# Patient Record
Sex: Male | Born: 1935 | Race: White | Hispanic: No | Marital: Married | Smoking: Never smoker
Health system: Southern US, Community
[De-identification: ages and names within clinical notes are randomized; demographics above are authoritative.]

## PROBLEM LIST (undated history)

## (undated) DIAGNOSIS — E079 Disorder of thyroid, unspecified: Secondary | ICD-10-CM

## (undated) DIAGNOSIS — K219 Gastro-esophageal reflux disease without esophagitis: Secondary | ICD-10-CM

## (undated) DIAGNOSIS — G20A1 Parkinson's disease without dyskinesia, without mention of fluctuations: Secondary | ICD-10-CM

## (undated) DIAGNOSIS — F0390 Unspecified dementia without behavioral disturbance: Secondary | ICD-10-CM

## (undated) DIAGNOSIS — G20 Parkinson's disease: Secondary | ICD-10-CM

## (undated) HISTORY — PX: PACEMAKER PLACEMENT: SHX43

## (undated) HISTORY — PX: DEEP BRAIN STIMULATOR PLACEMENT: SHX608

---

## 2013-02-03 ENCOUNTER — Emergency Department (HOSPITAL_COMMUNITY): Payer: Medicare Other

## 2013-02-03 ENCOUNTER — Emergency Department (HOSPITAL_COMMUNITY)
Admission: EM | Admit: 2013-02-03 | Discharge: 2013-02-03 | Disposition: A | Discharge status: Home / Self Care | Payer: Medicare Other | Attending: Emergency Medicine | Admitting: Emergency Medicine

## 2013-02-03 ENCOUNTER — Encounter (HOSPITAL_COMMUNITY): Payer: Self-pay | Admitting: Emergency Medicine

## 2013-02-03 DIAGNOSIS — S42123A Displaced fracture of acromial process, unspecified shoulder, initial encounter for closed fracture: Secondary | ICD-10-CM | POA: Insufficient documentation

## 2013-02-03 DIAGNOSIS — G20 Parkinson's disease: Secondary | ICD-10-CM | POA: Insufficient documentation

## 2013-02-03 DIAGNOSIS — Z79899 Other long term (current) drug therapy: Secondary | ICD-10-CM | POA: Insufficient documentation

## 2013-02-03 DIAGNOSIS — F0280 Dementia in other diseases classified elsewhere without behavioral disturbance: Secondary | ICD-10-CM | POA: Insufficient documentation

## 2013-02-03 DIAGNOSIS — S42121A Displaced fracture of acromial process, right shoulder, initial encounter for closed fracture: Secondary | ICD-10-CM

## 2013-02-03 DIAGNOSIS — G20A1 Parkinson's disease without dyskinesia, without mention of fluctuations: Secondary | ICD-10-CM | POA: Insufficient documentation

## 2013-02-03 DIAGNOSIS — Y9389 Activity, other specified: Secondary | ICD-10-CM | POA: Insufficient documentation

## 2013-02-03 DIAGNOSIS — Y92410 Unspecified street and highway as the place of occurrence of the external cause: Secondary | ICD-10-CM | POA: Insufficient documentation

## 2013-02-03 DIAGNOSIS — Z791 Long term (current) use of non-steroidal anti-inflammatories (NSAID): Secondary | ICD-10-CM | POA: Insufficient documentation

## 2013-02-03 HISTORY — DX: Unspecified dementia, unspecified severity, without behavioral disturbance, psychotic disturbance, mood disturbance, and anxiety: F03.90

## 2013-02-03 MED ORDER — NAPROXEN 500 MG PO TABS: 500.0000 mg | ORAL_TABLET | Freq: Two times a day (BID) | ORAL | Status: DC

## 2013-02-03 MED ORDER — TRAMADOL HCL 50 MG PO TABS: 50.0000 mg | ORAL_TABLET | Freq: Four times a day (QID) | ORAL | Status: DC | PRN

## 2013-02-03 MED ORDER — TRAMADOL HCL 50 MG PO TABS
50.0000 mg | ORAL_TABLET | Freq: Once | ORAL | Status: AC
  Administered 2013-02-03: 50 mg via ORAL
  Filled 2013-02-03: qty 1
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
## 2017-05-06 ENCOUNTER — Other Ambulatory Visit (HOSPITAL_COMMUNITY)
Admission: RE | Admit: 2017-05-06 | Discharge: 2017-05-06 | Disposition: A | Discharge status: Home / Self Care | Payer: Medicare Other | Source: Ambulatory Visit | Attending: Family Medicine | Admitting: Family Medicine

## 2017-05-06 DIAGNOSIS — N400 Enlarged prostate without lower urinary tract symptoms: Secondary | ICD-10-CM | POA: Diagnosis present

## 2017-05-06 DIAGNOSIS — E291 Testicular hypofunction: Secondary | ICD-10-CM | POA: Diagnosis present

## 2017-05-06 DIAGNOSIS — R7309 Other abnormal glucose: Secondary | ICD-10-CM | POA: Diagnosis present

## 2017-05-06 DIAGNOSIS — R5381 Other malaise: Secondary | ICD-10-CM | POA: Diagnosis present

## 2017-05-06 LAB — CBC WITH DIFFERENTIAL/PLATELET
BASOS ABS: 0.1 10*3/uL (ref 0.0–0.1)
BASOS PCT: 1 %
Eosinophils Absolute: 0.2 10*3/uL (ref 0.0–0.7)
Eosinophils Relative: 4 %
HCT: 40.5 % (ref 39.0–52.0)
Hemoglobin: 13.1 g/dL (ref 13.0–17.0)
Lymphocytes Relative: 14 %
Lymphs Abs: 0.8 10*3/uL (ref 0.7–4.0)
MCH: 30.2 pg (ref 26.0–34.0)
MCHC: 32.3 g/dL (ref 30.0–36.0)
MCV: 93.3 fL (ref 78.0–100.0)
MONO ABS: 0.4 10*3/uL (ref 0.1–1.0)
Monocytes Relative: 7 %
NEUTROS ABS: 4.3 10*3/uL (ref 1.7–7.7)
Neutrophils Relative %: 74 %
PLATELETS: 314 10*3/uL (ref 150–400)
RBC: 4.34 MIL/uL (ref 4.22–5.81)
RDW: 13.2 % (ref 11.5–15.5)
WBC: 5.7 10*3/uL (ref 4.0–10.5)

## 2017-05-06 LAB — COMPREHENSIVE METABOLIC PANEL
ALK PHOS: 68 U/L (ref 38–126)
ALT: 17 U/L (ref 17–63)
ANION GAP: 10 (ref 5–15)
AST: 30 U/L (ref 15–41)
Albumin: 3.8 g/dL (ref 3.5–5.0)
BUN: 23 mg/dL — ABNORMAL HIGH (ref 6–20)
CO2: 28 mmol/L (ref 22–32)
CREATININE: 1.15 mg/dL (ref 0.61–1.24)
Calcium: 9.3 mg/dL (ref 8.9–10.3)
Chloride: 98 mmol/L — ABNORMAL LOW (ref 101–111)
GFR calc Af Amer: >60 mL/min (ref >60)
GFR calc non Af Amer: 58 mL/min — ABNORMAL LOW (ref >60)
GLUCOSE: 105 mg/dL — AB (ref 65–99)
Potassium: 4.1 mmol/L (ref 3.5–5.1)
Sodium: 136 mmol/L (ref 135–145)
TOTAL PROTEIN: 6.7 g/dL (ref 6.5–8.1)
Total Bilirubin: 0.6 mg/dL (ref 0.3–1.2)

## 2017-05-06 LAB — T4, FREE: FREE T4: 0.81 ng/dL (ref 0.61–1.12)

## 2017-05-06 LAB — VITAMIN B12: Vitamin B-12: 936 pg/mL — ABNORMAL HIGH (ref 180–914)

## 2017-05-06 LAB — TSH: TSH: 1.403 u[IU]/mL (ref 0.350–4.500)

## 2017-05-06 LAB — PSA: Prostatic Specific Antigen: 0.99 ng/mL (ref 0.00–4.00)

## 2017-05-06 LAB — FERRITIN: Ferritin: 92 ng/mL (ref 24–336)

## 2017-05-07 LAB — DHEA-SULFATE: DHEA-SO4: 375.4 ug/dL — ABNORMAL HIGH (ref 20.8–226.4)

## 2017-05-07 LAB — T3, FREE: T3 FREE: 2.8 pg/mL (ref 2.0–4.4)

## 2017-05-07 LAB — MISC LABCORP TEST (SEND OUT): Labcorp test code: 82016

## 2017-05-07 LAB — ESTRADIOL: ESTRADIOL: 37.4 pg/mL (ref 7.6–42.6)

## 2017-05-11 LAB — MISC LABCORP TEST (SEND OUT): Labcorp test code: 140707

## 2018-01-07 ENCOUNTER — Emergency Department (HOSPITAL_COMMUNITY)
Admission: EM | Admit: 2018-01-07 | Discharge: 2018-01-07 | Disposition: A | Discharge status: Home / Self Care | Payer: Medicare Other | Attending: Emergency Medicine | Admitting: Emergency Medicine

## 2018-01-07 ENCOUNTER — Other Ambulatory Visit: Payer: Self-pay

## 2018-01-07 ENCOUNTER — Encounter (HOSPITAL_COMMUNITY): Payer: Self-pay

## 2018-01-07 ENCOUNTER — Emergency Department (HOSPITAL_COMMUNITY): Payer: Medicare Other

## 2018-01-07 DIAGNOSIS — G20 Parkinson's disease: Secondary | ICD-10-CM | POA: Insufficient documentation

## 2018-01-07 DIAGNOSIS — K5641 Fecal impaction: Secondary | ICD-10-CM

## 2018-01-07 DIAGNOSIS — K5900 Constipation, unspecified: Secondary | ICD-10-CM | POA: Diagnosis present

## 2018-01-07 DIAGNOSIS — R339 Retention of urine, unspecified: Secondary | ICD-10-CM | POA: Insufficient documentation

## 2018-01-07 DIAGNOSIS — F0280 Dementia in other diseases classified elsewhere without behavioral disturbance: Secondary | ICD-10-CM | POA: Diagnosis not present

## 2018-01-07 DIAGNOSIS — Z950 Presence of cardiac pacemaker: Secondary | ICD-10-CM | POA: Insufficient documentation

## 2018-01-07 HISTORY — DX: Parkinson's disease without dyskinesia, without mention of fluctuations: G20.A1

## 2018-01-07 HISTORY — DX: Parkinson's disease: G20

## 2018-01-07 LAB — URINALYSIS, ROUTINE W REFLEX MICROSCOPIC
BILIRUBIN URINE: NEGATIVE
GLUCOSE, UA: NEGATIVE mg/dL
Hgb urine dipstick: NEGATIVE
KETONES UR: NEGATIVE mg/dL
Leukocytes, UA: NEGATIVE
Nitrite: NEGATIVE
PH: 7 (ref 5.0–8.0)
Protein, ur: NEGATIVE mg/dL
SPECIFIC GRAVITY, URINE: 1.017 (ref 1.005–1.030)

## 2018-01-07 LAB — CBC WITH DIFFERENTIAL/PLATELET
BASOS ABS: 0.1 10*3/uL (ref 0.0–0.1)
BASOS PCT: 0 %
EOS ABS: 0.1 10*3/uL (ref 0.0–0.7)
Eosinophils Relative: 1 %
HCT: 38.4 % — ABNORMAL LOW (ref 39.0–52.0)
HEMOGLOBIN: 12.7 g/dL — AB (ref 13.0–17.0)
LYMPHS ABS: 0.8 10*3/uL (ref 0.7–4.0)
Lymphocytes Relative: 6 %
MCH: 31.1 pg (ref 26.0–34.0)
MCHC: 33.1 g/dL (ref 30.0–36.0)
MCV: 94.1 fL (ref 78.0–100.0)
Monocytes Absolute: 1 10*3/uL (ref 0.1–1.0)
Monocytes Relative: 7 %
NEUTROS PCT: 86 %
Neutro Abs: 11.2 10*3/uL — ABNORMAL HIGH (ref 1.7–7.7)
PLATELETS: 329 10*3/uL (ref 150–400)
RBC: 4.08 MIL/uL — AB (ref 4.22–5.81)
RDW: 13.7 % (ref 11.5–15.5)
WBC: 13.1 10*3/uL — AB (ref 4.0–10.5)

## 2018-01-07 LAB — COMPREHENSIVE METABOLIC PANEL
ALBUMIN: 3.9 g/dL (ref 3.5–5.0)
ALT: 7 U/L (ref 0–44)
AST: 20 U/L (ref 15–41)
Alkaline Phosphatase: 105 U/L (ref 38–126)
Anion gap: 7 (ref 5–15)
BUN: 27 mg/dL — ABNORMAL HIGH (ref 8–23)
CHLORIDE: 102 mmol/L (ref 98–111)
CO2: 27 mmol/L (ref 22–32)
CREATININE: 1.16 mg/dL (ref 0.61–1.24)
Calcium: 9.1 mg/dL (ref 8.9–10.3)
GFR calc Af Amer: >60 mL/min (ref >60)
GFR calc non Af Amer: 57 mL/min — ABNORMAL LOW (ref >60)
GLUCOSE: 107 mg/dL — AB (ref 70–99)
Potassium: 4.6 mmol/L (ref 3.5–5.1)
SODIUM: 136 mmol/L (ref 135–145)
Total Bilirubin: 0.7 mg/dL (ref 0.3–1.2)
Total Protein: 6.6 g/dL (ref 6.5–8.1)

## 2018-01-07 MED ORDER — MAGNESIUM CITRATE PO SOLN
1.0000 | Freq: Once | ORAL | Status: AC
  Administered 2018-01-07: 1 via ORAL
  Filled 2018-01-07: qty 296

## 2018-01-07 MED ORDER — SORBITOL 70 % SOLN
960.0000 mL | TOPICAL_OIL | Freq: Once | ORAL | Status: DC
  Filled 2018-01-07: qty 473

## 2018-01-07 MED ORDER — IOHEXOL 300 MG/ML  SOLN
100.0000 mL | Freq: Once | INTRAMUSCULAR | Status: AC | PRN
  Administered 2018-01-07: 100 mL via INTRAVENOUS

## 2018-01-07 MED ORDER — POLYETHYLENE GLYCOL 3350 17 G PO PACK: 17.0000 g | PACK | Freq: Every day | ORAL | 0 refills | Status: AC

## 2018-01-07 MED ORDER — FENTANYL CITRATE (PF) 100 MCG/2ML IJ SOLN
50.0000 ug | Freq: Once | INTRAMUSCULAR | Status: AC
  Administered 2018-01-07: 50 ug via INTRAMUSCULAR
  Filled 2018-01-07: qty 2

## 2018-01-07 NOTE — ED Notes (Signed)
Glycerin enema given.

## 2018-01-07 NOTE — Discharge Instructions (Signed)

## 2018-01-07 NOTE — ED Triage Notes (Signed)
Pt complaining of fecal impaction. Has tried 2 suppositories and enema without being able to get stool out.

## 2018-01-08 NOTE — ED Provider Notes (Signed)
Emergency Department Provider Note   I have reviewed the triage vital signs and the nursing notes.   HISTORY  Chief Complaint Fecal Impaction   HPI Gary Sellers is a 83 y.o. male with history of dementia Parkinson's disease who presents to the emergency department today secondary to constipation.  Patient has had no bowel movement last week and had hard bowel movements prior to that.  No recent medication changes but he did just get a deep brain stimulator placed approximately a week ago.  Patient states it hurts around his rectal area he is able to have small bowel movements and leakage of fluid but no significant amount.  Persistent worsening pain.  Also with pain in his lower abdomen. No other associated or modifying symptoms.    Past Medical History:  Diagnosis Date  . Dementia   . Parkinson disease (HCC)     There are no active problems to display for this patient.   Past Surgical History:  Procedure Laterality Date  . DEEP BRAIN STIMULATOR PLACEMENT    . PACEMAKER PLACEMENT      Current Outpatient Rx  . Order #: [id number] Class: Historical Med  . Order #: [id number] Class: Historical Med  . Order #: [id number] Class: Historical Med  . Order #: [id number] Class: Historical Med  . Order #: [id number] Class: Historical Med  . Order #: [id number] Class: Historical Med  . Order #: [id number] Class: Historical Med  . Order #: [id number] Class: Historical Med  . Order #: [id number] Class: Historical Med  . Order #: [id number] Class: Historical Med  . Order #: [id number] Class: Historical Med  . Order #: [id number] Class: Historical Med  . Order #: [id number] Class: Historical Med  . Order #: [id number] Class: Historical Med  . Order #: [id number] Class: Historical Med  . Order #: [id number] Class: Historical Med  . Order #: [id number] Class: Historical Med  . Order #: [id number] Class: Print  . Order #: [id number] Class: Print    Allergies Patient has no known allergies.  History reviewed. No  pertinent family history.  Social History Social History   Tobacco Use  . Smoking status: Never Smoker  . Smokeless tobacco: Never Used  Substance Use Topics  . Alcohol use: Never    Frequency: Never  . Drug use: Never    Review of Systems  All other systems negative except as documented in the HPI. All pertinent positives and negatives as reviewed in the HPI. ____________________________________________   PHYSICAL EXAM:  VITAL SIGNS: ED Triage Vitals [01/07/18 1530]  Enc Vitals Group     BP 137/78     Pulse Rate 87     Resp 18     Temp 97.6 F (36.4 C)     Temp Source Oral     SpO2 100 %     Weight 153 lb (69.4 kg)     Height 5' 10.5" (1.791 m)    Constitutional: Alert and oriented. Well appearing and in no acute distress. Eyes: Conjunctivae are normal. PERRL. EOMI. Head: Atraumatic. Nose: No congestion/rhinnorhea. Mouth/Throat: Mucous membranes are moist.  Oropharynx non-erythematous. Neck: No stridor.  No meningeal signs.   Cardiovascular: Normal rate, regular rhythm. Good peripheral circulation. Grossly normal heart sounds.   Respiratory: Normal respiratory effort.  No retractions. Lungs CTAB. Gastrointestinal: Soft and suprapubic ttp with bladder distension. No distention.  Musculoskeletal: No lower extremity tenderness nor edema. No gross deformities of extremities. Neurologic:  Normal speech and language. No gross focal neurologic deficits are appreciated.  Skin:  Skin is warm, dry and intact. No rash noted.  ____________________________________________  LABS (all labs ordered are listed, but only abnormal results are displayed)  Labs Reviewed  CBC WITH DIFFERENTIAL/PLATELET - Abnormal; Notable for the following components:      Result Value   WBC 13.1 (*)    RBC 4.08 (*)    Hemoglobin 12.7 (*)    HCT 38.4 (*)    Neutro Abs 11.2 (*)    All other components within normal limits  COMPREHENSIVE METABOLIC PANEL - Abnormal; Notable for the following  components:   Glucose, Bld 107 (*)    BUN 27 (*)    GFR calc non Af Amer 57 (*)    All other components within normal limits  URINALYSIS, ROUTINE W REFLEX MICROSCOPIC   ____________________________________________   RADIOLOGY  Ct Abdomen Pelvis W Contrast  Result Date: 01/07/2018 CLINICAL DATA:  Constipation and possible fecal impaction. EXAM: CT ABDOMEN AND PELVIS WITH CONTRAST TECHNIQUE: Multidetector CT imaging of the abdomen and pelvis was performed using the standard protocol following bolus administration of intravenous contrast. CONTRAST:  OMNIPAQUE IOHEXOL 300 MG/ML  SOLN COMPARISON:  None. FINDINGS: LOWER CHEST: There is no basilar pleural or apical pericardial effusion. HEPATOBILIARY: The hepatic contours and density are normal. There is no intra- or extrahepatic biliary dilatation. The gallbladder is normal. PANCREAS: The pancreatic parenchymal contours are normal and there is no ductal dilatation. There is no peripancreatic fluid collection. SPLEEN: Normal. ADRENALS/URINARY TRACT: --Adrenal glands: Normal. --Right kidney/ureter: There is pelviectasis and mild proximal ureteral dilatation. No obstruction identified. --Left kidney/ureter: Mild left ureteral dilatation. --Urinary bladder: Urinary bladder is markedly distended. STOMACH/BOWEL: --Stomach/Duodenum: There is no hiatal hernia or other gastric abnormality. The duodenal course and caliber are normal. --Small bowel: No dilatation or inflammation. --Colon: There is a large stool ball in the rectum, measuring 8.4 cm transverse by 7.5 cm AP. There is a large volume of colonic stool. --Appendix: Not visualized. No right lower quadrant inflammation or free fluid. VASCULAR/LYMPHATIC: Atherosclerotic calcification is present within the non-aneurysmal abdominal aorta, without hemodynamically significant stenosis. The portal vein, splenic vein, superior mesenteric vein and IVC are patent. No abdominal or pelvic lymphadenopathy.  REPRODUCTIVE: The prostate is displaced anteriorly by the large rectal stool ball. MUSCULOSKELETAL. Multilevel degenerative disc disease and facet arthrosis. No bony spinal canal stenosis. OTHER: None. IMPRESSION: 1. Large amount of stool throughout the colon with suspected fecal impaction. Rectal stool ball measures 8.4 x 7.5 cm on axial images. No evidence of stercoral colitis. 2. Markedly distended urinary bladder with associated mild hydroureter and pelviectasis. There may be a bladder outlet obstruction secondary to mass effect from stool ball. The prostate is deviated anteriorly and mildly flattened. 3.  Aortic Atherosclerosis (ICD10-I70.0). Electronically Signed   By: Deatra Robinson M.D.   On: 01/07/2018 20:48    ____________________________________________   PROCEDURES  Procedure(s) performed:   Fecal disimpaction Date/Time: 01/08/2018 6:40 PM Performed by: Marily Memos, MD Authorized by: Marily Memos, MD  Consent: Verbal consent obtained. Risks and benefits: risks, benefits and alternatives were discussed Consent given by: patient and spouse Patient understanding: patient states understanding of the procedure being performed Patient consent: the patient's understanding of the procedure matches consent given Required items: required blood products, implants, devices, and special equipment available Patient identity confirmed: verbally with patient Time out: Immediately prior to procedure a "time out" was called to verify the correct patient, procedure, equipment, support staff and site/side marked as required. Preparation: Patient was prepped and draped in the usual sterile fashion. Local anesthesia used: no  Anesthesia: Local anesthesia  used: no  Sedation: Patient sedated: no  Patient tolerance: Patient tolerated the procedure well with no immediate complications Comments: Large amount of hard, nonbloody stool removed. Subsequently a fleets enema was placed. Approximately 30  minutes later the patient had a large bowel movement and relief in those symptoms.    ____________________________________________   INITIAL IMPRESSION / ASSESSMENT AND PLAN / ED COURSE  Patient with fecal impaction of unclear etiology likely causing bladder outlet obstruction.  I did disimpact him he had a large bowel movement but was still retaining urine so a catheter was placed.  He will do a bowel cleanout regimen at home and follow-up with his doctor for catheter removal as I suspect the ability urinate normally after that.  No indication for admission at this time.  Stable for discharge.     Pertinent labs & imaging results that were available during my care of the patient were reviewed by me and considered in my medical decision making (see chart for details).  ____________________________________________  FINAL CLINICAL IMPRESSION(S) / ED DIAGNOSES  Final diagnoses:  Urinary retention  Fecal impaction (HCC)     MEDICATIONS GIVEN DURING THIS VISIT:  Medications  fentaNYL (SUBLIMAZE) injection 50 mcg (50 mcg Intramuscular Given 01/07/18 1901)  iohexol (OMNIPAQUE) 300 MG/ML solution 100 mL (100 mLs Intravenous Contrast Given 01/07/18 2016)  magnesium citrate solution 1 Bottle (1 Bottle Oral Given 01/07/18 2330)     NEW OUTPATIENT MEDICATIONS STARTED DURING THIS VISIT:  Discharge Medication List as of 01/07/2018 11:19 PM    START taking these medications   Details  polyethylene glycol (MIRALAX / GLYCOLAX) packet Take 17 g by mouth daily., Starting Thu 01/07/2018, Print        Note:  This note was prepared with assistance of Dragon voice recognition software. Occasional wrong-word or sound-a-like substitutions may have occurred due to the inherent limitations of voice recognition software.   Marily MemosMesner, Tamber Burtch, MD 01/08/18 [phone]

## 2019-12-28 ENCOUNTER — Inpatient Hospital Stay (HOSPITAL_COMMUNITY)
Admission: EM | Admit: 2019-12-28 | Discharge: 2020-01-04 | DRG: 175 | Disposition: A | Discharge status: Skilled Nursing Facility | Payer: Medicare Other | Attending: Family Medicine | Admitting: Family Medicine

## 2019-12-28 ENCOUNTER — Emergency Department (HOSPITAL_COMMUNITY): Payer: Medicare Other

## 2019-12-28 ENCOUNTER — Other Ambulatory Visit: Payer: Self-pay

## 2019-12-28 ENCOUNTER — Encounter (HOSPITAL_COMMUNITY): Payer: Self-pay

## 2019-12-28 DIAGNOSIS — J9601 Acute respiratory failure with hypoxia: Secondary | ICD-10-CM | POA: Diagnosis present

## 2019-12-28 DIAGNOSIS — R0602 Shortness of breath: Secondary | ICD-10-CM | POA: Diagnosis not present

## 2019-12-28 DIAGNOSIS — Z20822 Contact with and (suspected) exposure to covid-19: Secondary | ICD-10-CM | POA: Diagnosis present

## 2019-12-28 DIAGNOSIS — N1831 Chronic kidney disease, stage 3a: Secondary | ICD-10-CM | POA: Diagnosis present

## 2019-12-28 DIAGNOSIS — F0280 Dementia in other diseases classified elsewhere without behavioral disturbance: Secondary | ICD-10-CM | POA: Diagnosis present

## 2019-12-28 DIAGNOSIS — Z79899 Other long term (current) drug therapy: Secondary | ICD-10-CM | POA: Diagnosis not present

## 2019-12-28 DIAGNOSIS — K219 Gastro-esophageal reflux disease without esophagitis: Secondary | ICD-10-CM | POA: Diagnosis present

## 2019-12-28 DIAGNOSIS — I2694 Multiple subsegmental pulmonary emboli without acute cor pulmonale: Principal | ICD-10-CM | POA: Diagnosis present

## 2019-12-28 DIAGNOSIS — E039 Hypothyroidism, unspecified: Secondary | ICD-10-CM | POA: Diagnosis present

## 2019-12-28 DIAGNOSIS — D72829 Elevated white blood cell count, unspecified: Secondary | ICD-10-CM | POA: Diagnosis present

## 2019-12-28 DIAGNOSIS — G20 Parkinson's disease: Secondary | ICD-10-CM | POA: Diagnosis present

## 2019-12-28 DIAGNOSIS — Z950 Presence of cardiac pacemaker: Secondary | ICD-10-CM

## 2019-12-28 DIAGNOSIS — I361 Nonrheumatic tricuspid (valve) insufficiency: Secondary | ICD-10-CM | POA: Diagnosis not present

## 2019-12-28 DIAGNOSIS — I2699 Other pulmonary embolism without acute cor pulmonale: Secondary | ICD-10-CM | POA: Diagnosis not present

## 2019-12-28 DIAGNOSIS — I2609 Other pulmonary embolism with acute cor pulmonale: Secondary | ICD-10-CM | POA: Diagnosis not present

## 2019-12-28 LAB — COMPREHENSIVE METABOLIC PANEL
ALT: <5 U/L (ref 0–44)
AST: 20 U/L (ref 15–41)
Albumin: 3.5 g/dL (ref 3.5–5.0)
Alkaline Phosphatase: 71 U/L (ref 38–126)
Anion gap: 11 (ref 5–15)
BUN: 29 mg/dL — ABNORMAL HIGH (ref 8–23)
CO2: 22 mmol/L (ref 22–32)
Calcium: 8.9 mg/dL (ref 8.9–10.3)
Chloride: 104 mmol/L (ref 98–111)
Creatinine, Ser: 1.19 mg/dL (ref 0.61–1.24)
GFR calc Af Amer: >60 mL/min (ref >60)
GFR calc non Af Amer: 56 mL/min — ABNORMAL LOW (ref >60)
Glucose, Bld: 166 mg/dL — ABNORMAL HIGH (ref 70–99)
Potassium: 4.6 mmol/L (ref 3.5–5.1)
Sodium: 137 mmol/L (ref 135–145)
Total Bilirubin: 0.9 mg/dL (ref 0.3–1.2)
Total Protein: 6.1 g/dL — ABNORMAL LOW (ref 6.5–8.1)

## 2019-12-28 LAB — CBC
HCT: 41 % (ref 39.0–52.0)
Hemoglobin: 12.8 g/dL — ABNORMAL LOW (ref 13.0–17.0)
MCH: 29.7 pg (ref 26.0–34.0)
MCHC: 31.2 g/dL (ref 30.0–36.0)
MCV: 95.1 fL (ref 80.0–100.0)
Platelets: 201 10*3/uL (ref 150–400)
RBC: 4.31 MIL/uL (ref 4.22–5.81)
RDW: 13.2 % (ref 11.5–15.5)
WBC: 13.2 10*3/uL — ABNORMAL HIGH (ref 4.0–10.5)
nRBC: 0 % (ref 0.0–0.2)

## 2019-12-28 LAB — URINALYSIS, ROUTINE W REFLEX MICROSCOPIC
Bilirubin Urine: NEGATIVE
Glucose, UA: NEGATIVE mg/dL
Hgb urine dipstick: NEGATIVE
Ketones, ur: NEGATIVE mg/dL
Leukocytes,Ua: NEGATIVE
Nitrite: NEGATIVE
Protein, ur: NEGATIVE mg/dL
Specific Gravity, Urine: 1.009 (ref 1.005–1.030)
pH: 6 (ref 5.0–8.0)

## 2019-12-28 LAB — LACTIC ACID, PLASMA
Lactic Acid, Venous: 2.1 mmol/L (ref 0.5–1.9)
Lactic Acid, Venous: 1.4 mmol/L (ref 0.5–1.9)

## 2019-12-28 LAB — TROPONIN I (HIGH SENSITIVITY)
Troponin I (High Sensitivity): 297 ng/L (ref <18)
Troponin I (High Sensitivity): 307 ng/L (ref <18)

## 2019-12-28 LAB — BRAIN NATRIURETIC PEPTIDE: B Natriuretic Peptide: 752 pg/mL — ABNORMAL HIGH (ref 0.0–100.0)

## 2019-12-28 LAB — SARS CORONAVIRUS 2 BY RT PCR (HOSPITAL ORDER, PERFORMED IN ~~LOC~~ HOSPITAL LAB): SARS Coronavirus 2: NEGATIVE

## 2019-12-28 MED ORDER — HEPARIN BOLUS VIA INFUSION
4000.0000 [IU] | Freq: Once | INTRAVENOUS | Status: AC
  Administered 2019-12-28: 4000 [IU] via INTRAVENOUS

## 2019-12-28 MED ORDER — IOHEXOL 350 MG/ML SOLN
100.0000 mL | Freq: Once | INTRAVENOUS | Status: AC | PRN
  Administered 2019-12-28: 100 mL via INTRAVENOUS

## 2019-12-28 MED ORDER — SODIUM CHLORIDE 0.9 % IV BOLUS
1000.0000 mL | Freq: Once | INTRAVENOUS | Status: AC
  Administered 2019-12-28: 1000 mL via INTRAVENOUS

## 2019-12-28 MED ORDER — IPRATROPIUM-ALBUTEROL 0.5-2.5 (3) MG/3ML IN SOLN
3.0000 mL | Freq: Once | RESPIRATORY_TRACT | Status: AC
  Administered 2019-12-28: 3 mL via RESPIRATORY_TRACT
  Filled 2019-12-28: qty 3

## 2019-12-28 MED ORDER — HEPARIN (PORCINE) 25000 UT/250ML-% IV SOLN
1500.0000 [IU]/h | INTRAVENOUS | Status: DC
  Administered 2019-12-28: 1250 [IU]/h via INTRAVENOUS
  Administered 2019-12-30 – 2020-01-02 (×5): 1500 [IU]/h via INTRAVENOUS
  Filled 2019-12-28 (×7): qty 250

## 2019-12-28 NOTE — Discharge Instructions (Addendum)
You were evaluated in the Emergency Department and after careful evaluation, we did not find any emergent condition requiring admission or further testing in the hospital.  Your exam/testing today was overall reassuring.  Please return to the Emergency Department if you experience any worsening of your condition.  Thank you for allowing [place] to be a part of your care.  Information on my medicine - ELIQUIS (apixaban)  This medication education was reviewed with me or my healthcare representative as part of my discharge preparation.    Why was Eliquis prescribed for you? Eliquis was prescribed to treat blood clots that may have been found in the veins of your legs (deep vein thrombosis) or in your lungs (pulmonary embolism) and to reduce the risk of them occurring again.  What do You need to know about Eliquis ? The starting dose is 10 mg (two 5 mg tablets) taken TWICE daily for the FIRST SEVEN (7) DAYS, then on (enter date)  01/09/2020  the dose is reduced to ONE 5 mg tablet taken TWICE daily.  Eliquis may be taken with or without food.   Try to take the dose about the same time in the morning and in the evening. If you have difficulty swallowing the tablet whole please discuss with your pharmacist how to take the medication safely.  Take Eliquis exactly as prescribed and DO NOT stop taking Eliquis without talking to the doctor who prescribed the medication.  Stopping may increase your risk of developing a new blood clot.  Refill your prescription before you run out.  After discharge, you should have regular check-up appointments with your healthcare provider that is prescribing your Eliquis.    What do you do if you miss a dose? If a dose of ELIQUIS is not taken at the scheduled time, take it as soon as possible on the same day and twice-daily administration should be resumed. The dose should not be doubled to make up for a missed dose.  Important Safety Information A possible side  effect of Eliquis is bleeding. You should call your healthcare provider right away if you experience any of the following: ? Bleeding from an injury or your nose that does not stop. ? Unusual colored urine (red or dark brown) or unusual colored stools (red or black). ? Unusual bruising for unknown reasons. ? A serious fall or if you hit your head (even if there is no bleeding).  Some medicines may interact with Eliquis and might increase your risk of bleeding or clotting while on Eliquis. To help avoid this, consult your healthcare provider or pharmacist prior to using any new prescription or non-prescription medications, including herbals, vitamins, non-steroidal anti-inflammatory drugs (NSAIDs) and supplements.  This website has more information on Eliquis (apixaban): http://[email or web address].com/eliquis/home

## 2019-12-28 NOTE — ED Provider Notes (Signed)
[facility] EMERGENCY DEPARTMENT Provider Note   CSN: [id number] Arrival date & time: 12/28/19  9417     History Chief Complaint  Patient presents with  . Weakness    Gary Sellers is a 85 y.o. male.  Patient with hx Parkinson's disease presents with generalized weakness. Patient indicates has felt generally weak in past couple weeks, symptoms gradual onset, moderate, persistent, without acute or abrupt change today. Indicates went to [facility] ED yesterday, and was told lab work was ok. This Am went to go to bathroom w walker, and bil legs got weak, and slumped to ground. Pt denies pain or injury. Denies loc or syncope. EMS notes initial pulse ox in 80s, and bp 91/53. Pt given 400 cc ns. Pt notes recent poor po intake. No nvd. No abd pain. Denies fever or chills. Denies cough. No chest pain or discomfort. No gu c/o.   The history is provided by the patient and the EMS personnel.  Weakness Associated symptoms: no abdominal pain, no chest pain, no cough, no diarrhea, no dysuria, no fever, no headaches, no shortness of breath and no vomiting        Past Medical History:  Diagnosis Date  . Dementia (HCC)   . Parkinson disease (HCC)     There are no problems to display for this patient.   Past Surgical History:  Procedure Laterality Date  . DEEP BRAIN STIMULATOR PLACEMENT    . PACEMAKER PLACEMENT         No family history on file.  Social History   Tobacco Use  . Smoking status: Never Smoker  . Smokeless tobacco: Never Used  Vaping Use  . Vaping Use: Never used  Substance Use Topics  . Alcohol use: Never  . Drug use: Never    Home Medications Prior to Admission medications   Medication Sig Start Date End Date Taking? Authorizing Provider  ascorbic acid (VITAMIN C) 500 MG tablet Take 1 tablet by mouth 2 (two) times daily.    [provider]  carbidopa-levodopa (SINEMET CR) 50-200 MG tablet Take 1 tablet by mouth 4 (four) times daily. 09/02/17   [provider]  CHLOROPHYLL PO Take 5 mLs by mouth 2 (two) times daily.    [provider]  Coenzyme Q10 10 MG capsule Take 1 tablet by mouth daily.    [provider]  DHEA 25 MG CAPS Take 1 tablet by mouth daily.    [provider]  dorzolamide (TRUSOPT) 2 % ophthalmic solution Place 1 drop into both eyes daily. 12/22/17   [provider]  dorzolamide-timolol (COSOPT) 22.3-6.8 MG/ML ophthalmic solution  11/25/12   [provider]  Garlic 100 MG TABS Take 1 tablet by mouth daily.    [provider]  GLUTATHIONE PO Take 1 tablet by mouth daily.    [provider]  KRILL OIL PO Take 1 tablet by mouth daily.    [provider]  latanoprost (XALATAN) 0.005 % ophthalmic solution  10/29/12   [provider]  Multiple Vitamins-Minerals (MULTIVITAMIN WITH MINERALS) tablet Take 1 tablet by mouth daily.    [provider]  omeprazole (PRILOSEC) 40 MG capsule  01/26/13   [provider]  polyethylene glycol (MIRALAX / GLYCOLAX) packet Take 17 g by mouth daily. 01/07/18   Mesner, Barbara Cower, MD  testosterone cypionate (DEPOTESTOSTERONE CYPIONATE) 200 MG/ML injection Inject 200 mLs into the muscle every 21 ( twenty-one) days. 12/09/17   [provider]  Testosterone Propionate (FIRST-TESTOSTERONE MC)  2 % CREA Place 1 application onto the skin daily.    [provider]  thyroid (ARMOUR THYROID) 30 MG tablet Take 1 tablet by mouth daily.     [provider]  traMADol (ULTRAM) 50 MG tablet Take 1 tablet (50 mg total) by mouth every 6 (six) hours as needed for pain. 02/03/13   Eber Hong, MD  TRAVATAN Z 0.004 % SOLN ophthalmic solution  11/25/12   [provider]    Allergies    Patient has no known allergies.  Review of Systems   Review of Systems  Constitutional: Negative for fever.  HENT: Negative for sore throat.   Eyes: Negative for redness.  Respiratory: Negative for cough and  shortness of breath.   Cardiovascular: Negative for chest pain, palpitations and leg swelling.  Gastrointestinal: Negative for abdominal pain, diarrhea and vomiting.  Endocrine: Negative for polyuria.  Genitourinary: Negative for dysuria and flank pain.  Musculoskeletal: Negative for back pain and neck pain.  Skin: Negative for rash.  Neurological: Positive for weakness. Negative for headaches.  Hematological: Does not bruise/bleed easily.  Psychiatric/Behavioral: Negative for agitation.    Physical Exam Updated Vital Signs BP 101/60 (BP Location: Right Arm)   Pulse 86   Temp (!) 97.4 F (36.3 C) (Oral)   Resp (!) 24   SpO2 91%   Physical Exam Vitals and nursing note reviewed.  Constitutional:      Appearance: Normal appearance. He is well-developed.  HENT:     Head: Atraumatic.     Nose: Nose normal.     Mouth/Throat:     Mouth: Mucous membranes are moist.     Pharynx: Oropharynx is clear.  Eyes:     General: No scleral icterus.    Conjunctiva/sclera: Conjunctivae normal.     Pupils: Pupils are equal, round, and reactive to light.  Neck:     Trachea: No tracheal deviation.  Cardiovascular:     Rate and Rhythm: Normal rate and regular rhythm.     Pulses: Normal pulses.     Heart sounds: Normal heart sounds. No murmur heard.  No friction rub. No gallop.   Pulmonary:     Effort: Pulmonary effort is normal. No accessory muscle usage or respiratory distress.     Breath sounds: Normal breath sounds.  Abdominal:     General: Bowel sounds are normal. There is no distension.     Palpations: Abdomen is soft.     Tenderness: There is no abdominal tenderness. There is no guarding.  Genitourinary:    Comments: No cva tenderness. Musculoskeletal:        General: No swelling or tenderness.     Cervical back: Normal range of motion and neck supple. No rigidity.     Right lower leg: No edema.     Left lower leg: No edema.  Skin:    General: Skin is warm and dry.      Findings: No rash.     Comments: No rash. No cellulitis.   Neurological:     Mental Status: He is alert.     Comments: Alert, speech clear. Motor intact bil, stre 5/5. Sens grossly intact.   Psychiatric:        Mood and Affect: Mood normal.     ED Results / Procedures / Treatments   Labs (all labs ordered are listed, but only abnormal results are displayed) Results for orders placed or performed during the hospital encounter of 12/28/19  Lactic acid, plasma  Result Value  Ref Range   Lactic Acid, Venous 2.1 (HH) 0.5 - 1.9 mmol/L  CBC  Result Value Ref Range   WBC 13.2 (H) 4.0 - 10.5 K/uL   RBC 4.31 4.22 - 5.81 MIL/uL   Hemoglobin 12.8 (L) 13.0 - 17.0 g/dL   HCT 67.8 39 - 52 %   MCV 95.1 80.0 - 100.0 fL   MCH 29.7 26.0 - 34.0 pg   MCHC 31.2 30.0 - 36.0 g/dL   RDW 93.8 10.1 - 75.1 %   Platelets 201 150 - 400 K/uL   nRBC 0.0 0.0 - 0.2 %  Comprehensive metabolic panel  Result Value Ref Range   Sodium 137 135 - 145 mmol/L   Potassium 4.6 3.5 - 5.1 mmol/L   Chloride 104 98 - 111 mmol/L   CO2 22 22 - 32 mmol/L   Glucose, Bld 166 (H) 70 - 99 mg/dL   BUN 29 (H) 8 - 23 mg/dL   Creatinine, Ser 0.25 0.61 - 1.24 mg/dL   Calcium 8.9 8.9 - 85.2 mg/dL   Total Protein 6.1 (L) 6.5 - 8.1 g/dL   Albumin 3.5 3.5 - 5.0 g/dL   AST 20 15 - 41 U/L   ALT <5 0 - 44 U/L   Alkaline Phosphatase 71 38 - 126 U/L   Total Bilirubin 0.9 0.3 - 1.2 mg/dL   GFR calc non Af Amer 56 (L) >60 mL/min   GFR calc Af Amer >60 >60 mL/min   Anion gap 11 5 - 15  Lactic acid, plasma  Result Value Ref Range   Lactic Acid, Venous 1.4 0.5 - 1.9 mmol/L   DG Chest Port 1 View  Result Date: 12/28/2019 CLINICAL DATA:  Shortness of breath. EXAM: PORTABLE CHEST 1 VIEW COMPARISON:  February 03, 2013. FINDINGS: The heart size and mediastinal contours are within normal limits. Both lungs are clear. No pneumothorax or pleural effusion is noted. The visualized skeletal structures are unremarkable. IMPRESSION: No active disease.  Electronically Signed   By: Lupita Raider M.D.   On: 12/28/2019 10:46    EKG EKG Interpretation  Date/Time:  Wednesday December 28 2019 10:21:10 EDT Ventricular Rate:  85 PR Interval:    QRS Duration: 119 QT Interval:  426 QTC Calculation: 507 R Axis:   101 Text Interpretation: Sinus rhythm Nonspecific intraventricular conduction delay Nonspecific T wave abnormality Artifact in lead(s) I II III aVR aVL aVF V2 V3 V4 V5 V6 No previous tracing Confirmed by Cathren Laine ([id number]) on 12/28/2019 11:48:22 AM   Radiology DG Chest Port 1 View  Result Date: 12/28/2019 CLINICAL DATA:  Shortness of breath. EXAM: PORTABLE CHEST 1 VIEW COMPARISON:  February 03, 2013. FINDINGS: The heart size and mediastinal contours are within normal limits. Both lungs are clear. No pneumothorax or pleural effusion is noted. The visualized skeletal structures are unremarkable. IMPRESSION: No active disease. Electronically Signed   By: Lupita Raider M.D.   On: 12/28/2019 10:46    Procedures Procedures (including critical care time)  Medications Ordered in ED Medications - No data to display  ED Course  I have reviewed the triage vital signs and the nursing notes.  Pertinent labs & imaging results that were available during my care of the patient were reviewed by me and considered in my medical decision making (see chart for details).    MDM Rules/Calculators/A&P  Labs sent. Monitor.   Reviewed nursing notes and prior charts for additional history. [facility] - noted then was report of low pulse ox at home, but normal pulse ox in ED - cxr neg, covid neg, ct head neg, and basic lab work up largely unremarkable.   Initial labs reviewed/interpreted by me - lactate sl high. Pt afebrile, denies fever/chills/sweats. Pt does note recent poor po intake, ?dehydration. Iv ns bolus.   CXR reviewed/interpreted by me - no pna.   Additional labs reviewed/interpreted by me  - with ivf, delta lactate is normal. Suspected soft bp and mild increased lactate due to dehydration. Rectal temp normal. No hx fever/chills.  Additional ivf. Po fluids. UA pending.   1545, signed out to Dr Pilar PlateBero to check UA, recheck pt, and dispo appropriately.     Final Clinical Impression(s) / ED Diagnoses Final diagnoses:  None    Rx / DC Orders ED Discharge Orders    None       Cathren LaineSteinl, Eiman Maret, MD 12/28/19 [id number]

## 2019-12-28 NOTE — ED Notes (Signed)
CRITICAL VALUE ALERT  Critical Value:  Lactic acid 2.1  Date & Time Notied:  12/28/19 1145  Provider Notified: [email or web address]  Orders Received/Actions taken: md notified

## 2019-12-28 NOTE — ED Notes (Signed)
Trop 297 reported to edp bero

## 2019-12-28 NOTE — ED Notes (Signed)
hospitalist in room  

## 2019-12-28 NOTE — Progress Notes (Signed)
ANTICOAGULATION CONSULT NOTE - Initial Consult  Pharmacy Consult for Heparin Indication: pulmonary embolus  No Known Allergies  Patient Measurements: Height: 5\' 10"  (177.8 cm) Weight: 70 kg (154 lb 5.2 oz) IBW/kg (Calculated) : 73 Heparin Dosing Weight:  70kg  Vital Signs: Temp: 98.7 F (37.1 C) (09/08 1254) Temp Source: Rectal (09/08 1254) BP: 103/65 (09/08 1942) Pulse Rate: 89 (09/08 2145)  Labs: Recent Labs    12/28/19 1040  HGB 12.8*  HCT 41.0  PLT 201  CREATININE 1.19    Estimated Creatinine Clearance: 46.6 mL/min (by C-G formula based on SCr of 1.19 mg/dL).   Medical History: Past Medical History:  Diagnosis Date  . Dementia (HCC)   . Parkinson disease (HCC)     Assessment: CC/HPI: Weakness, Hypoxia, CT with multiple segmental and subsegmental pulmonary emboli with signs of heart strain.   PMH: Parkinson's dz, dementia, PPM, deep brain stimulator  Anticoag: Start IV heparin for new PE.02/27/20 Baseline Hgb 12.8 Plts 201.Start IV heparin for new PE. - 9/8 CT: Positive for acute bilateral pulmonary emboli with CT evidence of right heart strain (RV/LV Ratio = 1.8) consistent with at least submassive (intermediate risk) PE.   Goal of Therapy:  Heparin level 0.3-0.7 units/ml Monitor platelets by anticoagulation protocol: Yes   Plan:  Heparin 4000 unit IV bolus Heparin infusion at 1250 units/hr Heparin level and CBC daily.   Gary Sellers, PharmD, BCPS Clinical Staff Pharmacist [email or web address] Merilynn Finland, Gary Sellers 12/28/2019,9:53 PM

## 2019-12-28 NOTE — ED Notes (Signed)
Pt is 87% on room air. Pt does not have or wear O2 at home. EDP notified

## 2019-12-28 NOTE — ED Provider Notes (Addendum)
  Provider Note MRN:  [id number]  Arrival date & time: 12/28/19    ED Course and Medical Decision Making  Assumed care from Dr. Denton Lank at shift change.  History of Parkinson's, nearly fell today, suspect mild dehydration, will provide fluids and likely discharge thereafter.  Still awaiting urinalysis.  Upon reassessment, patient's urinalysis is normal.  He is found to be hypoxic, 86 to 87% on room air.  His chest x-ray is clear.  Given these findings and his poor mobility status there is concern for pulmonary embolism.  CT is obtained, revealing multiple segmental and subsegmental pulmonary emboli with signs of heart strain.  On reevaluation patient is resting comfortably, satting 94% on 3 L nasal cannula.  Starting heparin.  Will admit to ICU versus stepdown unit.  I have had discussions with Dr. Thomes Dinning of hospital service here at [facility] as well as Dr. Benjamin Stain of critical care.  Patient's troponin has returned at 297.  With this, the ideal situation would be to admit patient to the [facility] intensive care unit.  Unfortunately there is a significant waiting time for beds at this time.  Alternative plan would be to admit to [facility], ICU and transfer when able.  Plan for now is to attempt admission and transfer to [facility], ICU.  If extensive wait time, would consider admission here or if decompensating ED to ED transfer.  .Critical Care Performed by: Sabas Sous, MD Authorized by: Sabas Sous, MD   Critical care provider statement:    Critical care time (minutes):  32   Critical care was necessary to treat or prevent imminent or life-threatening deterioration of the following conditions: Acute pulmonary embolism.   Critical care was time spent personally by me on the following activities:  Discussions with consultants, evaluation of patient's response to treatment, examination of patient, ordering and performing treatments and interventions, ordering and review of laboratory  studies, ordering and review of radiographic studies, pulse oximetry, re-evaluation of patient's condition, obtaining history from patient or surrogate and review of old charts    Final Clinical Impressions(s) / ED Diagnoses     ICD-10-CM   1. SOB (shortness of breath)  R06.02 DG Chest Port 1V same Day    DG Chest Port 1V same Day  2. Multiple subsegmental pulmonary emboli without acute cor pulmonale [facility])  I26.94     ED Discharge Orders    None        Elmer Sow. Pilar Plate, MD [facility] Emergency Medicine [facility] [email or web address]    Sabas Sous, MD 12/28/19 1931    Sabas Sous, MD 12/28/19 2148    Sabas Sous, MD 12/28/19 2232

## 2019-12-28 NOTE — ED Notes (Signed)
Pt is aware we need urine sample, urinal at bedside.  

## 2019-12-28 NOTE — ED Triage Notes (Signed)
EMS reports pt from home.  Reports has parkinson's disease.  Reports went to [facility] ER for eval of weakness and sob.  Reports tests came back normal except blood sugar was 232.  This morning pt went to the bathroom with his walker and got weak while walking and slumped down to the ground.  Reports was incontinent of stool.  EMS arrived and room air o2 sat was 84%, increased to 95% on 4liters.  BP was initially 91/53.  EMS gave approx bolus.  Reports was alert but lethargic.  Reports more alert at this time.  Pt alert but HOH.

## 2019-12-29 ENCOUNTER — Inpatient Hospital Stay (HOSPITAL_COMMUNITY): Payer: Medicare Other

## 2019-12-29 ENCOUNTER — Encounter (HOSPITAL_COMMUNITY): Payer: Self-pay | Admitting: Internal Medicine

## 2019-12-29 DIAGNOSIS — I361 Nonrheumatic tricuspid (valve) insufficiency: Secondary | ICD-10-CM

## 2019-12-29 DIAGNOSIS — I2699 Other pulmonary embolism without acute cor pulmonale: Secondary | ICD-10-CM

## 2019-12-29 DIAGNOSIS — I2609 Other pulmonary embolism with acute cor pulmonale: Secondary | ICD-10-CM

## 2019-12-29 LAB — CBC
HCT: 37.2 % — ABNORMAL LOW (ref 39.0–52.0)
Hemoglobin: 12 g/dL — ABNORMAL LOW (ref 13.0–17.0)
MCH: 30.5 pg (ref 26.0–34.0)
MCHC: 32.3 g/dL (ref 30.0–36.0)
MCV: 94.4 fL (ref 80.0–100.0)
Platelets: 185 10*3/uL (ref 150–400)
RBC: 3.94 MIL/uL — ABNORMAL LOW (ref 4.22–5.81)
RDW: 13.2 % (ref 11.5–15.5)
WBC: 13 10*3/uL — ABNORMAL HIGH (ref 4.0–10.5)
nRBC: 0 % (ref 0.0–0.2)

## 2019-12-29 LAB — HEPARIN LEVEL (UNFRACTIONATED)
Heparin Unfractionated: 0.45 IU/mL (ref 0.30–0.70)
Heparin Unfractionated: <0.1 IU/mL — ABNORMAL LOW (ref 0.30–0.70)
Heparin Unfractionated: 0.31 IU/mL (ref 0.30–0.70)

## 2019-12-29 LAB — TSH: TSH: 2.796 u[IU]/mL (ref 0.350–4.500)

## 2019-12-29 LAB — ECHOCARDIOGRAM COMPLETE
Area-P 1/2: 2.46 cm2
Height: 70 in
S' Lateral: 2.72 cm
Weight: 2469.15 oz

## 2019-12-29 MED ORDER — ONDANSETRON HCL 4 MG PO TABS: 4.0000 mg | ORAL_TABLET | Freq: Four times a day (QID) | ORAL | Status: DC | PRN

## 2019-12-29 MED ORDER — ACETAMINOPHEN 325 MG PO TABS: 650.0000 mg | ORAL_TABLET | Freq: Four times a day (QID) | ORAL | Status: DC | PRN

## 2019-12-29 MED ORDER — IPRATROPIUM-ALBUTEROL 0.5-2.5 (3) MG/3ML IN SOLN
3.0000 mL | Freq: Four times a day (QID) | RESPIRATORY_TRACT | Status: DC | PRN
  Administered 2019-12-29: 3 mL via RESPIRATORY_TRACT
  Filled 2019-12-29: qty 3

## 2019-12-29 MED ORDER — ALBUTEROL SULFATE (2.5 MG/3ML) 0.083% IN NEBU
INHALATION_SOLUTION | RESPIRATORY_TRACT | Status: AC
  Administered 2019-12-29: 2.5 mg
  Filled 2019-12-29: qty 3

## 2019-12-29 MED ORDER — SODIUM CHLORIDE 0.9 % IV SOLN: 250.0000 mL | INTRAVENOUS | Status: DC | PRN

## 2019-12-29 MED ORDER — HALOPERIDOL LACTATE 5 MG/ML IJ SOLN
2.0000 mg | Freq: Once | INTRAMUSCULAR | Status: AC
  Administered 2019-12-29: 2 mg via INTRAVENOUS
  Filled 2019-12-29: qty 1

## 2019-12-29 MED ORDER — SODIUM CHLORIDE 0.9% FLUSH
3.0000 mL | Freq: Two times a day (BID) | INTRAVENOUS | Status: DC
  Administered 2019-12-29 – 2020-01-04 (×10): 3 mL via INTRAVENOUS

## 2019-12-29 MED ORDER — SODIUM CHLORIDE 0.9% FLUSH: 3.0000 mL | INTRAVENOUS | Status: DC | PRN

## 2019-12-29 MED ORDER — CHLORHEXIDINE GLUCONATE CLOTH 2 % EX PADS
6.0000 | MEDICATED_PAD | Freq: Every day | CUTANEOUS | Status: DC
  Administered 2019-12-30 – 2020-01-04 (×4): 6 via TOPICAL

## 2019-12-29 MED ORDER — ONDANSETRON HCL 4 MG/2ML IJ SOLN: 4.0000 mg | Freq: Four times a day (QID) | INTRAMUSCULAR | Status: DC | PRN

## 2019-12-29 MED ORDER — CARBIDOPA-LEVODOPA 25-100 MG PO TABS
1.0000 | ORAL_TABLET | ORAL | Status: DC
  Administered 2019-12-29 – 2020-01-04 (×45): 1 via ORAL
  Filled 2019-12-29 (×47): qty 1

## 2019-12-29 MED ORDER — THYROID 30 MG PO TABS
30.0000 mg | ORAL_TABLET | Freq: Every day | ORAL | Status: DC
  Administered 2019-12-29 – 2020-01-04 (×7): 30 mg via ORAL
  Filled 2019-12-29 (×9): qty 1

## 2019-12-29 MED ORDER — IPRATROPIUM-ALBUTEROL 0.5-2.5 (3) MG/3ML IN SOLN
RESPIRATORY_TRACT | Status: AC
  Administered 2019-12-29: 3 mL
  Filled 2019-12-29: qty 3

## 2019-12-29 MED ORDER — ACETAMINOPHEN 650 MG RE SUPP: 650.0000 mg | Freq: Four times a day (QID) | RECTAL | Status: DC | PRN

## 2019-12-29 NOTE — ED Notes (Signed)
Pt asleep.

## 2019-12-29 NOTE — Progress Notes (Signed)
*  PRELIMINARY RESULTS* Echocardiogram 2D Echocardiogram has been performed.  Stacey Drain 12/29/2019, 4:32 PM

## 2019-12-29 NOTE — ED Notes (Signed)
Per Dr. Shona Simpson pt. To stay at [facility] and to ensure pt is on strict bed rest and needs to be on high therapeutic range of heparin. Pharmacy and EDP aware.

## 2019-12-29 NOTE — Progress Notes (Addendum)
ANTICOAGULATION CONSULT NOTE   Pharmacy Consult for Heparin Indication: pulmonary embolus  No Known Allergies  Patient Measurements: Height: 5\' 10"  (177.8 cm) Weight: 70 kg (154 lb 5.2 oz) IBW/kg (Calculated) : 73 Heparin Dosing Weight:  70kg  Vital Signs: BP: 94/58 (09/09 1130) Pulse Rate: 85 (09/09 1145)  Labs: Recent Labs    12/28/19 1040 12/28/19 2100 12/28/19 2259 12/29/19 0346 12/29/19 1113  HGB 12.8*  --   --  12.0*  --   HCT 41.0  --   --  37.2*  --   PLT 201  --   --  185  --   HEPARINUNFRC  --   --   --  0.45 <0.10*  CREATININE 1.19  --   --   --   --   TROPONINIHS  --  297* 307*  --   --     Estimated Creatinine Clearance: 46.6 mL/min (by C-G formula based on SCr of 1.19 mg/dL).   Medical History: Past Medical History:  Diagnosis Date  . Dementia (HCC)   . Parkinson disease (HCC)     Assessment: Start IV heparin for new PE.02/28/20 Baseline Hgb 12.8 Plts 201.Start IV heparin for new PE. - 9/8 CT: Positive for acute bilateral pulmonary emboli with CT evidence of right heart strain (RV/LV Ratio = 1.8) consistent with at least submassive (intermediate risk) PE.   Heparin level 0.10- low due to patient pulling out IV. Restarted at 1100. Will increase dose to try and increase heparin level to upper range since previous HL was 0.45  Goal of Therapy:  Heparin level 0.5-0.7 per MD Monitor platelets by anticoagulation protocol: Yes   Plan:  Restart heparin infusion at 1350 units/hr Check heparin level in 6-8 hours and daily CBC daily.  04-25-1974, PharmD Clinical Pharmacist 12/29/2019 12:40 PM

## 2019-12-29 NOTE — Progress Notes (Signed)
ANTICOAGULATION CONSULT NOTE   Pharmacy Consult for Heparin Indication: pulmonary embolus  No Known Allergies  Patient Measurements: Height: 5\' 10"  (177.8 cm) Weight: 70 kg (154 lb 5.2 oz) IBW/kg (Calculated) : 73 Heparin Dosing Weight:  70kg  Vital Signs: BP: 111/72 (09/09 0230) Pulse Rate: 89 (09/09 0230)  Labs: Recent Labs    12/28/19 1040 12/28/19 2100 12/28/19 2259 12/29/19 0346  HGB 12.8*  --   --  12.0*  HCT 41.0  --   --  37.2*  PLT 201  --   --  185  HEPARINUNFRC  --   --   --  0.45  CREATININE 1.19  --   --   --   TROPONINIHS  --  297* 307*  --     Estimated Creatinine Clearance: 46.6 mL/min (by C-G formula based on SCr of 1.19 mg/dL).   Medical History: Past Medical History:  Diagnosis Date  . Dementia (HCC)   . Parkinson disease (HCC)     Assessment: Start IV heparin for new PE.02/28/20 Baseline Hgb 12.8 Plts 201.Start IV heparin for new PE. - 9/8 CT: Positive for acute bilateral pulmonary emboli with CT evidence of right heart strain (RV/LV Ratio = 1.8) consistent with at least submassive (intermediate risk) PE.  Heparin level 0.45 units/ml  Goal of Therapy:  Heparin level 0.3-0.7 units/ml Monitor platelets by anticoagulation protocol: Yes   Plan:  Continue heparin infusion at 1250 units/hr Check heparin level later today to confirm Heparin level and CBC daily.   Sachi Boulay Poteet 12/29/2019,4:52 AM

## 2019-12-29 NOTE — Consult Note (Signed)
Full note to follow  -  Asked to see pt re need for tansfer for massive PE but he denies cp or resting sob   with  sats upper 90s  on 4-5 lpm NP and BP/ pulse do not suggest hemodynamic compromise   Discussed with wife/ EDP/pharmacy aware > high dose IV hep/ cancel transfer to [facility] as thrombolytics not needed   Sandrea Hughs, MD Pulmonary and Critical Care Medicine [facility] Cell [phone]   After 7:00 pm call Elink  [phone]

## 2019-12-29 NOTE — ED Notes (Signed)
Contacted pharmacy. Per pharmacy pt. Is receiving max therapeutic dose of heparin.

## 2019-12-29 NOTE — H&P (Addendum)
History and Physical    Gary CaperWilliam Kluth GNF:[id number] DOB: 09/17/1934 DOA: 12/28/2019  PCP: System, Provider Not In   Patient coming from: Home  Chief Complaint: Kennedy BuckerWeakness/dyspnea  HPI: Gary Sellers is a 85 y.o. male with medical history significant for Parkinson disease with deep brain stimulator, hypothyroidism, and GERD who presented via EMS after he was noted to have significant lethargy and weakness as well as progressive shortness of breath.  His symptoms have progressed over the course of 2 weeks and appears to be of gradual onset.  [facility] his symptoms and everything came back normal with the exception of an elevated blood glucose level and so he was sent home.  EMS was called after he slumped to the ground and was noted to have some stool incontinence.  EMS reported room oxygen saturation of approximately 84% which increased to 95% on 4 L.  He was also noted to have some initial soft blood pressure readings with systolics in the 90s.  Patient has recent poor oral intake, but denies any abdominal pain, chest pain, fever, chills, cough, nausea, vomiting, or diarrhea.   ED Course: Patient has stable vital signs and is afebrile.  Patient was noted to have submassive PE with multiple segmental and subsegmental pulmonary emboli and heart strain noted on CT chest.  He has otherwise been resting comfortably on 4-5 L nasal cannula.  He has been started on heparin drip.  [facility], but beds have not been available.  He has been reassessed by pulmonology with recommendations to [facility] heparin drip as there is no need for TPA.  Patient is noted to have BNP of 752 as well as elevated troponins of 297 and subsequently 307.  Lactic acid initially elevated 2.1 has come down to 1.4.  His creatinine is stable at 1.19.  He is noted to have some leukocytosis of 13,000.  Covid testing negative.  Review  of Systems: All others reviewed as noted above and otherwise negative.  Past Medical History:  Diagnosis Date  . Dementia (HCC)   . Parkinson disease [facility](HCC)     Past Surgical History:  Procedure Laterality Date  . DEEP BRAIN STIMULATOR PLACEMENT    . PACEMAKER PLACEMENT       reports that he has never smoked. He has never used smokeless tobacco. He reports that he does not drink alcohol and does not use drugs.  No Known Allergies  No family history on file.  Prior to Admission medications   Medication Sig Start Date End Date Taking? Authorizing Provider  ascorbic acid (VITAMIN C) 500 MG tablet Take 1 tablet by mouth 2 (two) times daily.   Yes [provider]  carbidopa-levodopa (SINEMET IR) 25-100 MG tablet Take 1 tablet by mouth every 3 (three) hours. 12/09/19  Yes [provider]  Coenzyme Q10 10 MG capsule Take 1 tablet by mouth daily.   Yes [provider]  DHEA 25 MG CAPS Take 1 tablet by mouth daily.   Yes [provider]  dorzolamide-timolol (COSOPT) 22.3-6.8 MG/ML ophthalmic solution Place 1 drop into both eyes 2 (two) times daily.  11/25/12  Yes [provider]  Garlic 100 MG TABS Take 1 tablet by mouth daily.   Yes [provider]  GLUTATHIONE PO Take 1 tablet by mouth daily.   Yes [provider]  KRILL OIL PO Take 1 tablet by mouth daily.  Yes [provider]  Multiple Vitamins-Minerals (MULTIVITAMIN WITH MINERALS) tablet Take 1 tablet by mouth daily.   Yes [provider]  omeprazole (PRILOSEC) 40 MG capsule  01/26/13  Yes [provider]  polyethylene glycol (MIRALAX / GLYCOLAX) packet Take 17 g by mouth daily. 01/07/18  Yes Mesner, Barbara Cower, MD  testosterone cypionate (DEPOTESTOSTERONE CYPIONATE) 200 MG/ML injection Inject 200 mLs into the muscle every 21 ( twenty-one) days. 12/09/17  Yes [provider]  thyroid (ARMOUR THYROID) 30 MG tablet Take 1 tablet by mouth daily.    Yes  [provider]  TRAVATAN Z 0.004 % SOLN ophthalmic solution Place 1 drop into both eyes at bedtime.  11/25/12  Yes [provider]  traMADol (ULTRAM) 50 MG tablet Take 1 tablet (50 mg total) by mouth every 6 (six) hours as needed for pain. Patient not taking: Reported on 12/28/2019 02/03/13   Eber Hong, MD    Physical Exam: Vitals:   12/29/19 1230 12/29/19 1300 12/29/19 1315 12/29/19 1330  BP: 114/68 (!) 123/110  112/68  Pulse: 85 83 87 83  Resp:  16 19   Temp:      TempSrc:      SpO2: 96% 96% 95% 97%  Weight:      Height:        Constitutional: NAD, calm, comfortable Vitals:   12/29/19 1230 12/29/19 1300 12/29/19 1315 12/29/19 1330  BP: 114/68 (!) 123/110  112/68  Pulse: 85 83 87 83  Resp:  16 19   Temp:      TempSrc:      SpO2: 96% 96% 95% 97%  Weight:      Height:       Eyes: lids and conjunctivae normal ENMT: Mucous membranes are moist.  Neck: normal, supple Respiratory: clear to auscultation bilaterally. Normal respiratory effort. No accessory muscle use. Currently on 5L [place]. Cardiovascular: Regular rate and rhythm, no murmurs. No extremity edema. Abdomen: no tenderness, no distention. Bowel sounds positive.  Musculoskeletal:  No joint deformity upper and lower extremities.   Skin: no rashes, lesions, ulcers.  Psychiatric: Pleasant.  Labs on Admission: I have personally reviewed following labs and imaging studies  CBC: Recent Labs  Lab 12/28/19 1040 12/29/19 0346  WBC 13.2* 13.0*  HGB 12.8* 12.0*  HCT 41.0 37.2*  MCV 95.1 94.4  PLT 201 185   Basic Metabolic Panel: Recent Labs  Lab 12/28/19 1040  NA 137  K 4.6  CL 104  CO2 22  GLUCOSE 166*  BUN 29*  CREATININE 1.19  CALCIUM 8.9   GFR: Estimated Creatinine Clearance: 46.6 mL/min (by C-G formula based on SCr of 1.19 mg/dL). Liver Function Tests: Recent Labs  Lab 12/28/19 1040  AST 20  ALT <5  ALKPHOS 71  BILITOT 0.9  PROT 6.1*  ALBUMIN 3.5   No results for input(s):  LIPASE, AMYLASE in the last 168 hours. No results for input(s): AMMONIA in the last 168 hours. Coagulation Profile: No results for input(s): INR, PROTIME in the last 168 hours. Cardiac Enzymes: No results for input(s): CKTOTAL, CKMB, CKMBINDEX, TROPONINI in the last 168 hours. BNP (last 3 results) No results for input(s): PROBNP in the last 8760 hours. HbA1C: No results for input(s): HGBA1C in the last 72 hours. CBG: No results for input(s): GLUCAP in the last 168 hours. Lipid Profile: No results for input(s): CHOL, HDL, LDLCALC, TRIG, CHOLHDL, LDLDIRECT in the last 72 hours. Thyroid Function Tests: No results for input(s): TSH, T4TOTAL, FREET4, T3FREE, THYROIDAB in  the last 72 hours. Anemia Panel: No results for input(s): VITAMINB12, FOLATE, FERRITIN, TIBC, IRON, RETICCTPCT in the last 72 hours. Urine analysis:    Component Value Date/Time   COLORURINE YELLOW 12/28/2019 1717   APPEARANCEUR CLEAR 12/28/2019 1717   LABSPEC 1.009 12/28/2019 1717   PHURINE 6.0 12/28/2019 1717   GLUCOSEU NEGATIVE 12/28/2019 1717   HGBUR NEGATIVE 12/28/2019 1717   BILIRUBINUR NEGATIVE 12/28/2019 1717   KETONESUR NEGATIVE 12/28/2019 1717   PROTEINUR NEGATIVE 12/28/2019 1717   NITRITE NEGATIVE 12/28/2019 1717   LEUKOCYTESUR NEGATIVE 12/28/2019 1717    Radiological Exams on Admission: CT ANGIO CHEST PE W OR WO CONTRAST  Result Date: 12/28/2019 CLINICAL DATA:  Low oxygen saturation EXAM: CT ANGIOGRAPHY CHEST WITH CONTRAST TECHNIQUE: Multidetector CT imaging of the chest was performed using the standard protocol during bolus administration of intravenous contrast. Multiplanar CT image reconstructions and MIPs were obtained to evaluate the vascular anatomy. CONTRAST:  OMNIPAQUE IOHEXOL 350 MG/ML SOLN COMPARISON:  Chest x-ray 12/28/2019, CT 01/07/2018 FINDINGS: Cardiovascular: Satisfactory opacification of the pulmonary arteries to the segmental level. Acute thrombus within the truncus anterior artery,  right upper lobe segmental and subsegmental vessels, right middle lobar, segmental and subsegmental vessels, as well as right inter lobar and right lower lobe segmental and subsegmental vessels. Positive for acute embolus within descending left pulmonary artery, left upper and lower lobe segmental and subsegmental pulmonary vessels. Positive for right heart strain with RV LV ratio of 1.8. Reflux of contrast into the intra hepatic IVC. Cardiomegaly. No pericardial effusion. Nonaneurysmal aorta. Mild aortic atherosclerosis. Mild coronary vascular calcification. Mediastinum/Nodes: Midline trachea. No thyroid mass. No suspicious adenopathy. Mild air in fluid distension of upper esophagus. Suspected distal esophageal thickening. Lungs/Pleura: No pneumothorax or pleural effusion. Mild ground-glass density in the posterior left upper lobe and the subpleural lingula. Upper Abdomen: Nodular adrenal glands.  No acute abnormality. Musculoskeletal: No chest wall abnormality. No acute or significant osseous findings. Review of the MIP images confirms the above findings. IMPRESSION: 1. Positive for acute bilateral pulmonary emboli with CT evidence of right heart strain (RV/LV Ratio = 1.8) consistent with at least submassive (intermediate risk) PE. The presence of right heart strain has been associated with an increased risk of morbidity and mortality. 2. Cardiomegaly. 3. Mild ground-glass density in the posterior left upper lobe and subpleural lingula, could reflect small foci of pneumonia, to include atypical or viral etiology. Pulmonary infarctions are also considered in the differential. 4. Aortic atherosclerosis. Critical Value/emergent results were called by telephone at the time of interpretation on 12/28/2019 at 9:47 pm to provider [facility] , who verbally acknowledged these results. Aortic Atherosclerosis (ICD10-I70.0). Electronically Signed   By: Jasmine Pang M.D.   On: 12/28/2019 21:47   DG Chest Port 1  View  Result Date: 12/28/2019 CLINICAL DATA:  Shortness of breath. EXAM: PORTABLE CHEST 1 VIEW COMPARISON:  February 03, 2013. FINDINGS: The heart size and mediastinal contours are within normal limits. Both lungs are clear. No pneumothorax or pleural effusion is noted. The visualized skeletal structures are unremarkable. IMPRESSION: No active disease. Electronically Signed   By: Lupita Raider M.D.   On: 12/28/2019 10:46   DG Chest Port 1V same Day  Result Date: 12/28/2019 CLINICAL DATA:  Shortness of breath. EXAM: PORTABLE CHEST 1 VIEW COMPARISON:  Earlier film, same date. FINDINGS: The cardiac silhouette, mediastinal and hilar contours are within normal limits and stable. Streaky basilar scarring changes. No definite infiltrates or effusions. No worrisome pulmonary lesions. The bony  thorax is intact. IMPRESSION: Streaky basilar scarring changes but no definite infiltrates or effusions. Electronically Signed   By: Rudie Meyer M.D.   On: 12/28/2019 20:26    EKG: Independently reviewed. SR 85bpm. Extensive artifact.  Assessment/Plan Active Problems:   PE (pulmonary thromboembolism) (HCC)    Acute hypoxemic respiratory failure secondary to submassive PE -Noted RV strain on imaging -Pulmonology to consult them continue following; no need for TPA or transfer noted -Continue IV heparin drip at high end of therapeutic level for 5 days and then discharge with anticoagulation once stable  Elevated troponin likely secondary to RV strain above -No chest pain noted -EKG without any concerning findings -Plan to obtain 2D echocardiogram -Continue heparin drip  CKDIIIa -Currently at baseline creatinine around 1.1 -Continue to monitor  History of parkinsonism -Continue Sinemet  History of GERD -Continue PPI  History of hypothyroidism -Continue home medication   DVT prophylaxis: Heparin drip Code Status: Full Family Communication: Dawn, caretaker at bedside Disposition Plan:Admit for  treatment of submassive PE Consults called:PCCM Admission status: Inpatient, SDU   Valbona Slabach D Sherryll Burger DO [facility]  If 7PM-7AM, please contact night-coverage [email or web address]  12/29/2019, 2:15 PM

## 2019-12-29 NOTE — ED Notes (Signed)
Pt removed IV in right forearm. IV site bleeding. Bleeding controlled. Pt skin cleaned. Pt tolerated well. Heparin drip paused until additional IV access can be obtained.

## 2019-12-29 NOTE — Progress Notes (Signed)
ANTICOAGULATION CONSULT NOTE   Pharmacy Consult for Heparin Indication: pulmonary embolus  No Known Allergies  Patient Measurements: Height: 5\' 10"  (177.8 cm) Weight: 70 kg (154 lb 5.2 oz) IBW/kg (Calculated) : 73 Heparin Dosing Weight:  70kg  Vital Signs: BP: 123/68 (09/09 1730) Pulse Rate: 80 (09/09 1730)  Labs: Recent Labs    12/28/19 1040 12/28/19 2100 12/28/19 2259 12/29/19 0346 12/29/19 1113 12/29/19 1900  HGB 12.8*  --   --  12.0*  --   --   HCT 41.0  --   --  37.2*  --   --   PLT 201  --   --  185  --   --   HEPARINUNFRC  --   --   --  0.45 <0.10* 0.31  CREATININE 1.19  --   --   --   --   --   TROPONINIHS  --  297* 307*  --   --   --    Estimated Creatinine Clearance: 46.6 mL/min (by C-G formula based on SCr of 1.19 mg/dL).  Medical History: Past Medical History:  Diagnosis Date  . Dementia (HCC)   . Parkinson disease (HCC)    Assessment: Start IV heparin for new PE.02/28/20 Baseline Hgb 12.8 Plts 201.Start IV heparin for new PE. - 9/8 CT: Positive for acute bilateral pulmonary emboli with CT evidence of right heart strain (RV/LV Ratio = 1.8) consistent with at least submassive (intermediate risk) PE.  Heparin level 0.31 after rate increased to 1350 units/hr No noted bleeding complications.  Goal of Therapy:  Heparin level 0.3-0.7 units/ml Monitor platelets by anticoagulation protocol: Yes   Plan:  Will increase heparin to 1500 units/hr Check heparin level to ensure therapeutic goals Heparin level and CBC daily.  04-25-1976, PharmD., MS Clinical Pharmacist  Thank you for allowing pharmacy to be part of this patients care team. 12/29/2019,8:06 PM

## 2019-12-29 NOTE — Progress Notes (Signed)
Pt has been quite restless, biting at safety mitts to get them off, and once they are off he is pulling at lines, ekg cables, catheter. He has already pulled out 3 IVs in the ED. He is disoriented to place time and situation. He is trying to leave the bed even though he has been told he is on strict bedrest for the PE he has. Midlevel ordered a dose of Haldol 2mg  IV.

## 2019-12-30 LAB — BASIC METABOLIC PANEL
Anion gap: 9 (ref 5–15)
BUN: 27 mg/dL — ABNORMAL HIGH (ref 8–23)
CO2: 21 mmol/L — ABNORMAL LOW (ref 22–32)
Calcium: 8.8 mg/dL — ABNORMAL LOW (ref 8.9–10.3)
Chloride: 108 mmol/L (ref 98–111)
Creatinine, Ser: 1.21 mg/dL (ref 0.61–1.24)
GFR calc Af Amer: >60 mL/min (ref >60)
GFR calc non Af Amer: 55 mL/min — ABNORMAL LOW (ref >60)
Glucose, Bld: 102 mg/dL — ABNORMAL HIGH (ref 70–99)
Potassium: 3.8 mmol/L (ref 3.5–5.1)
Sodium: 138 mmol/L (ref 135–145)

## 2019-12-30 LAB — MRSA PCR SCREENING: MRSA by PCR: NEGATIVE

## 2019-12-30 LAB — MAGNESIUM: Magnesium: 2.1 mg/dL (ref 1.7–2.4)

## 2019-12-30 LAB — CBC
HCT: 37.2 % — ABNORMAL LOW (ref 39.0–52.0)
Hemoglobin: 11.7 g/dL — ABNORMAL LOW (ref 13.0–17.0)
MCH: 30.2 pg (ref 26.0–34.0)
MCHC: 31.5 g/dL (ref 30.0–36.0)
MCV: 95.9 fL (ref 80.0–100.0)
Platelets: 187 10*3/uL (ref 150–400)
RBC: 3.88 MIL/uL — ABNORMAL LOW (ref 4.22–5.81)
RDW: 13.2 % (ref 11.5–15.5)
WBC: 8.1 10*3/uL (ref 4.0–10.5)
nRBC: 0 % (ref 0.0–0.2)

## 2019-12-30 LAB — HEPARIN LEVEL (UNFRACTIONATED): Heparin Unfractionated: 0.57 IU/mL (ref 0.30–0.70)

## 2019-12-30 MED ORDER — HALOPERIDOL LACTATE 5 MG/ML IJ SOLN
2.0000 mg | Freq: Four times a day (QID) | INTRAMUSCULAR | Status: DC | PRN
  Administered 2019-12-30 – 2020-01-02 (×2): 2 mg via INTRAVENOUS
  Filled 2019-12-30 (×2): qty 1

## 2019-12-30 NOTE — Progress Notes (Signed)
ANTICOAGULATION CONSULT NOTE   Pharmacy Consult for Heparin Indication: pulmonary embolus  No Known Allergies  Patient Measurements: Height: 5\' 11"  (180.3 cm) Weight: 65.8 kg (145 lb 1 oz) IBW/kg (Calculated) : 75.3 Heparin Dosing Weight:  70kg  Vital Signs: Temp: 98 F (36.7 C) (09/10 0400) Temp Source: Axillary (09/10 0400) BP: 88/52 (09/10 0600) Pulse Rate: 65 (09/10 0600)  Labs: Recent Labs    12/28/19 1040 12/28/19 1040 12/28/19 2100 12/28/19 2259 12/29/19 0346 12/29/19 0346 12/29/19 1113 12/29/19 1900 12/30/19 0526  HGB 12.8*   < >  --   --  12.0*  --   --   --  11.7*  HCT 41.0  --   --   --  37.2*  --   --   --  37.2*  PLT 201  --   --   --  185  --   --   --  187  HEPARINUNFRC  --   --   --   --  0.45   < > <0.10* 0.31 0.57  CREATININE 1.19  --   --   --   --   --   --   --  1.21  TROPONINIHS  --   --  297* 307*  --   --   --   --   --    < > = values in this interval not displayed.    Estimated Creatinine Clearance: 43.1 mL/min (by C-G formula based on SCr of 1.21 mg/dL).   Medical History: Past Medical History:  Diagnosis Date   Dementia (HCC)    Parkinson disease (HCC)     Assessment: Start IV heparin for new PE.02/29/20 Baseline Hgb 12.8 Plts 201.Start IV heparin for new PE. - 9/8 CT: Positive for acute bilateral pulmonary emboli with CT evidence of right heart strain (RV/LV Ratio = 1.8) consistent with at least submassive (intermediate risk) PE.   -heparin level at goal on 1500 units/hr  Goal of Therapy:  Heparin level 0.5-0.7 per MD Monitor platelets by anticoagulation protocol: Yes   Plan:  -Continue heparin at 1500 units/hr -Daily heparin level and CBC  04-25-1976, PharmD Clinical Pharmacist **Pharmacist phone directory can now be found on [email or web address] ([email or web address]).  Listed under [facility].

## 2019-12-30 NOTE — Progress Notes (Signed)
PROGRESS NOTE    Gary Sellers  ZOX:[id number] DOB: Jul 09, 1934 DOA: 12/28/2019 PCP: System, Provider Not In   Brief Narrative:  Per HPI: Gary Sellers is a 85 y.o. male with medical history significant for Parkinson disease with deep brain stimulator, hypothyroidism, and GERD who presented via EMS after he was noted to have significant lethargy and weakness as well as progressive shortness of breath.  His symptoms have progressed over the course of 2 weeks and appears to be of gradual onset.  He reportedly went to [facility] emergency department for evaluation of his symptoms and everything came back normal with the exception of an elevated blood glucose level and so he was sent home.  EMS was called after he slumped to the ground and was noted to have some stool incontinence.  EMS reported room oxygen saturation of approximately 84% which increased to 95% on 4 L.  He was also noted to have some initial soft blood pressure readings with systolics in the 90s.  Patient has recent poor oral intake, but denies any abdominal pain, chest pain, fever, chills, cough, nausea, vomiting, or diarrhea.  9/10: Patient has been admitted with acute hypoxemic respiratory failure in the setting of submassive PE and has been started on heparin drip.  2D echocardiogram with LVEF 55-60% and signs of right ventricular overload noted.  He has been noted to have significant agitation overnight requiring Haldol.  Assessment & Plan:   Active Problems:   PE (pulmonary thromboembolism) (HCC)   Acute hypoxemic respiratory failure secondary to submassive PE -Noted RV strain on imaging -Pulmonology to consult them continue following; no need for TPA or transfer noted -Continue IV heparin drip at high end of therapeutic level for 5 days and then discharge with anticoagulation once stable, currently day 2/5 -Plan for heme/onc follow up outpatient to assess for hypercoagulability; will obtain panel while here  Elevated troponin  likely secondary to RV strain above -No chest pain noted -EKG without any concerning findings -2D echocardiogram reviewed with LVEF 55-60% and no wall motion abnormalities.  RV strain noted. -Continue heparin drip  CKDIIIa -Currently at baseline creatinine around 1.1-1.2 -Continue to monitor  History of parkinsonism -Continue Sinemet  History of GERD -Continue PPI  History of hypothyroidism -Continue home medication -TSH 2.7   DVT prophylaxis: Heparin drip Code Status: Full code Family Communication: Discussed with daughter on phone 9/10 Disposition Plan:  Status is: Inpatient  Remains inpatient appropriate because:IV treatments appropriate due to intensity of illness or inability to take PO and Inpatient level of care appropriate due to severity of illness   Dispo: The patient is from: Home              Anticipated d/c is to: Home              Anticipated d/c date is: > 3 days              Patient currently is not medically stable to d/c.  Patient requires ongoing IV heparin treatment for submassive PE.  Consultants:   PCCM  Procedures:   See below  2D echocardiogram 9/9 with LVEF 55-60% and RV overload  Antimicrobials:   None   Subjective: Patient seen and evaluated today and he is quite somnolent.  He has received doses of Haldol overnight for agitation that has been recurrent.  Vital signs appear stable this morning.  He was resting comfortably on 5 L nasal cannula oxygen.  Objective: Vitals:   12/30/19 0500 12/30/19 0600 12/30/19 0700  12/30/19 0800  BP: 96/77 (!) 88/52 124/64   Pulse: 69 65 74   Resp: Temp:    98.6 F (37 C)  TempSrc:    Axillary  SpO2: 97% 100% 100%   Weight: 65.8 kg     Height:        Intake/Output Summary (Last 24 hours) at 12/30/2019 0918 Last data filed at 12/30/2019 0800 Gross per 24 hour  Intake --  Output 700 ml  Net -700 ml   Filed Weights   12/28/19 2149 12/29/19 2200 12/30/19 0500  Weight: 70 kg  68.8 kg 65.8 kg    Examination:  General exam: Appears calm and comfortable, somnolent Respiratory system: Clear to auscultation. Respiratory effort normal.  5 L nasal cannula oxygen. Cardiovascular system: S1 & S2 heard, RRR.  Gastrointestinal system: Abdomen is nondistended, soft and nontender. Central nervous system: Somnolent Extremities: No edema Skin: No rashes, lesions or ulcers Psychiatry: Cannot be assessed    Data Reviewed: I have personally reviewed following labs and imaging studies  CBC: Recent Labs  Lab 12/28/19 1040 12/29/19 0346 12/30/19 0526  WBC 13.2* 13.0* 8.1  HGB 12.8* 12.0* 11.7*  HCT 41.0 37.2* 37.2*  MCV 95.1 94.4 95.9  PLT 201 185 187   Basic Metabolic Panel: Recent Labs  Lab 12/28/19 1040 12/30/19 0526  NA 137 138  K 4.6 3.8  CL 104 108  CO2 22 21*  GLUCOSE 166* 102*  BUN 29* 27*  CREATININE 1.19 1.21  CALCIUM 8.9 8.8*  MG  --  2.1   GFR: Estimated Creatinine Clearance: 43.1 mL/min (by C-G formula based on SCr of 1.21 mg/dL). Liver Function Tests: Recent Labs  Lab 12/28/19 1040  AST 20  ALT <5  ALKPHOS 71  BILITOT 0.9  PROT 6.1*  ALBUMIN 3.5   No results for input(s): LIPASE, AMYLASE in the last 168 hours. No results for input(s): AMMONIA in the last 168 hours. Coagulation Profile: No results for input(s): INR, PROTIME in the last 168 hours. Cardiac Enzymes: No results for input(s): CKTOTAL, CKMB, CKMBINDEX, TROPONINI in the last 168 hours. BNP (last 3 results) No results for input(s): PROBNP in the last 8760 hours. HbA1C: No results for input(s): HGBA1C in the last 72 hours. CBG: No results for input(s): GLUCAP in the last 168 hours. Lipid Profile: No results for input(s): CHOL, HDL, LDLCALC, TRIG, CHOLHDL, LDLDIRECT in the last 72 hours. Thyroid Function Tests: Recent Labs    12/29/19 0346  TSH 2.796   Anemia Panel: No results for input(s): VITAMINB12, FOLATE, FERRITIN, TIBC, IRON, RETICCTPCT in the last 72  hours. Sepsis Labs: Recent Labs  Lab 12/28/19 1040 12/28/19 1456  LATICACIDVEN 2.1* 1.4    Recent Results (from the past 240 hour(s))  SARS Coronavirus 2 by RT PCR (hospital order, performed in [facility] hospital lab) Nasopharyngeal Nasopharyngeal Swab     Status: None   Collection Time: 12/28/19  8:02 PM   Specimen: Nasopharyngeal Swab  Result Value Ref Range Status   SARS Coronavirus 2 NEGATIVE NEGATIVE Final    Comment: (NOTE) SARS-CoV-2 target nucleic acids are NOT DETECTED.  The SARS-CoV-2 RNA is generally detectable in upper and lower respiratory specimens during the acute phase of infection. The lowest concentration of SARS-CoV-2 viral copies this assay can detect is 250 copies / mL. A negative result does not preclude SARS-CoV-2 infection and should not be used as the sole basis for treatment or other patient management decisions.  A negative result  may occur with improper specimen collection / handling, submission of specimen other than nasopharyngeal swab, presence of viral mutation(s) within the areas targeted by this assay, and inadequate number of viral copies (<250 copies / mL). A negative result must be combined with clinical observations, patient history, and epidemiological information.  Fact Sheet for Patients:   BoilerBrush.com.cy  Fact Sheet for Healthcare Providers: https://pope.com/  This test is not yet approved or  cleared by the [place] FDA and has been authorized for detection and/or diagnosis of SARS-CoV-2 by FDA under an Emergency Use Authorization (EUA).  This EUA will remain in effect (meaning this test can be used) for the duration of the COVID-19 declaration under Section 564(b)(1) of the Act, 21 U.S.C. section 360bbb-3(b)(1), unless the authorization is terminated or revoked sooner.  Performed at [facility], [address], [place], [place] [address]   MRSA PCR Screening     Status:  None   Collection Time: 12/29/19 10:21 PM   Specimen: Nasal Mucosa; Nasopharyngeal  Result Value Ref Range Status   MRSA by PCR NEGATIVE NEGATIVE Final    Comment:        The GeneXpert MRSA Assay (FDA approved for NASAL specimens only), is one component of a comprehensive MRSA colonization surveillance program. It is not intended to diagnose MRSA infection nor to guide or monitor treatment for MRSA infections. Performed at [facility], [address] [place], [place] [address]          Radiology Studies: CT ANGIO CHEST PE W OR WO CONTRAST  Result Date: 12/28/2019 CLINICAL DATA:  Low oxygen saturation EXAM: CT ANGIOGRAPHY CHEST WITH CONTRAST TECHNIQUE: Multidetector CT imaging of the chest was performed using the standard protocol during bolus administration of intravenous contrast. Multiplanar CT image reconstructions and MIPs were obtained to evaluate the vascular anatomy. CONTRAST:  OMNIPAQUE IOHEXOL 350 MG/ML SOLN COMPARISON:  Chest x-ray 12/28/2019, CT 01/07/2018 FINDINGS: Cardiovascular: Satisfactory opacification of the pulmonary arteries to the segmental level. Acute thrombus within the truncus anterior artery, right upper lobe segmental and subsegmental vessels, right middle lobar, segmental and subsegmental vessels, as well as right inter lobar and right lower lobe segmental and subsegmental vessels. Positive for acute embolus within descending left pulmonary artery, left upper and lower lobe segmental and subsegmental pulmonary vessels. Positive for right heart strain with RV LV ratio of 1.8. Reflux of contrast into the intra hepatic IVC. Cardiomegaly. No pericardial effusion. Nonaneurysmal aorta. Mild aortic atherosclerosis. Mild coronary vascular calcification. Mediastinum/Nodes: Midline trachea. No thyroid mass. No suspicious adenopathy. Mild air in fluid distension of upper esophagus. Suspected distal esophageal thickening. Lungs/Pleura: No pneumothorax or pleural  effusion. Mild ground-glass density in the posterior left upper lobe and the subpleural lingula. Upper Abdomen: Nodular adrenal glands.  No acute abnormality. Musculoskeletal: No chest wall abnormality. No acute or significant osseous findings. Review of the MIP images confirms the above findings. IMPRESSION: 1. Positive for acute bilateral pulmonary emboli with CT evidence of right heart strain (RV/LV Ratio = 1.8) consistent with at least submassive (intermediate risk) PE. The presence of right heart strain has been associated with an increased risk of morbidity and mortality. 2. Cardiomegaly. 3. Mild ground-glass density in the posterior left upper lobe and subpleural lingula, could reflect small foci of pneumonia, to include atypical or viral etiology. Pulmonary infarctions are also considered in the differential. 4. Aortic atherosclerosis. Critical Value/emergent results were called by telephone at the time of interpretation on 12/28/2019 at 9:47 pm to provider [facility] ,  who verbally acknowledged these results. Aortic Atherosclerosis (ICD10-I70.0). Electronically Signed   By: Jasmine Pang M.D.   On: 12/28/2019 21:47   DG Chest Port 1 View  Result Date: 12/28/2019 CLINICAL DATA:  Shortness of breath. EXAM: PORTABLE CHEST 1 VIEW COMPARISON:  February 03, 2013. FINDINGS: The heart size and mediastinal contours are within normal limits. Both lungs are clear. No pneumothorax or pleural effusion is noted. The visualized skeletal structures are unremarkable. IMPRESSION: No active disease. Electronically Signed   By: Lupita Raider M.D.   On: 12/28/2019 10:46   DG Chest Port 1V same Day  Result Date: 12/28/2019 CLINICAL DATA:  Shortness of breath. EXAM: PORTABLE CHEST 1 VIEW COMPARISON:  Earlier film, same date. FINDINGS: The cardiac silhouette, mediastinal and hilar contours are within normal limits and stable. Streaky basilar scarring changes. No definite infiltrates or effusions. No worrisome pulmonary  lesions. The bony thorax is intact. IMPRESSION: Streaky basilar scarring changes but no definite infiltrates or effusions. Electronically Signed   By: Rudie Meyer M.D.   On: 12/28/2019 20:26   ECHOCARDIOGRAM COMPLETE  Result Date: 12/29/2019    ECHOCARDIOGRAM REPORT   Patient Name:   Gary Sellers Date of Exam: 12/29/2019 Medical Rec #:  [id number]      Height:       70.0 in Accession #:    [id number]     Weight:       154.3 lb Date of Birth:  09-29-1934       BSA:          1.870 m Patient Age:    85 years       BP:           111/75 mmHg Patient Gender: M              HR:           78 bpm. [facility] Procedure: 2D Echo, Cardiac Doppler and Color Doppler Indications:    Pulmonary Embolus 415.19 / I26.99  History:        Patient has no prior history of Echocardiogram examinations.                 (From Hx) Dementia & Parkinson disease.  Sonographer:    Celesta Gentile RCS Referring Phys: [id number] Lamont Dowdy [facility]  Sonographer Comments: Patient unawear of his surroundings. "Fought" with patient entire exam. Best I could obtain. IMPRESSIONS  1. Left ventricular ejection fraction, by estimation, is 55 to 60%. The left ventricle has normal function. The left ventricle has no regional wall motion abnormalities. Left ventricular diastolic parameters are indeterminate. There is the interventricular septum is flattened in diastole ('D' shaped left ventricle), consistent with right ventricular volume overload.  2. McConnell's sign consistent with diagnosis of pulmonary embolus. Right ventricular systolic function is severely reduced. The right ventricular size is moderately enlarged. There is moderately elevated pulmonary artery systolic pressure. The estimated right ventricular systolic pressure is 56.7 mmHg.  3. Right atrial size was upper normal.  4. The mitral valve is grossly normal. Trivial mitral valve regurgitation.  5. Tricuspid valve regurgitation is mild to moderate.  6. The aortic valve is tricuspid.  Aortic valve regurgitation is not visualized.  7. The inferior vena cava is dilated in size with <50% respiratory variability, suggesting right atrial pressure of 15 mmHg. FINDINGS  Left Ventricle: Left ventricular ejection fraction, by estimation, is 55 to 60%. The left ventricle has normal function. The left ventricle has no regional  wall motion abnormalities. The left ventricular internal cavity size was normal in size. There is  borderline left ventricular hypertrophy. The interventricular septum is flattened in diastole ('D' shaped left ventricle), consistent with right ventricular volume overload. Left ventricular diastolic parameters are indeterminate. Right Ventricle: McConnell's sign consistent with diagnosis of pulmonary embolus. The right ventricular size is moderately enlarged. No increase in right ventricular wall thickness. Right ventricular systolic function is severely reduced. There is moderately elevated pulmonary artery systolic pressure. The tricuspid regurgitant velocity is 3.23 m/s, and with an assumed right atrial pressure of 15 mmHg, the estimated right ventricular systolic pressure is 56.7 mmHg. Left Atrium: Left atrial size was normal in size. Right Atrium: Right atrial size was upper normal. Pericardium: There is no evidence of pericardial effusion. Mitral Valve: The mitral valve is grossly normal. Trivial mitral valve regurgitation. Tricuspid Valve: The tricuspid valve is grossly normal. Tricuspid valve regurgitation is mild to moderate. Aortic Valve: The aortic valve is tricuspid. There is mild aortic valve annular calcification. Aortic valve regurgitation is not visualized. Pulmonic Valve: The pulmonic valve was not well visualized. Pulmonic valve regurgitation is trivial. Aorta: The aortic root is normal in size and structure. Venous: The inferior vena cava is dilated in size with less than 50% respiratory variability, suggesting right atrial pressure of 15 mmHg. IAS/Shunts: No atrial  level shunt detected by color flow Doppler.  LEFT VENTRICLE PLAX 2D LVIDd:         3.82 cm  Diastology LVIDs:         2.72 cm  LV e' medial:    5.00 cm/s LV PW:         1.12 cm  LV E/e' medial:  6.8 LV IVS:        0.88 cm  LV e' lateral:   9.46 cm/s LVOT diam:     1.80 cm  LV E/e' lateral: 3.6 LV SV:         44 LV SV Index:   24 LVOT Area:     2.54 cm  RIGHT VENTRICLE RV S prime:     10.60 cm/s TAPSE (M-mode): 1.8 cm LEFT ATRIUM             Index       RIGHT ATRIUM           Index LA diam:        3.00 cm 1.60 cm/m  RA Area:     20.10 cm LA Vol (A2C):   48.6 ml 26.00 ml/m RA Volume:   60.60 ml  32.41 ml/m LA Vol (A4C):   53.0 ml 28.35 ml/m LA Biplane Vol: 53.2 ml 28.46 ml/m  AORTIC VALVE LVOT Vmax:   84.40 cm/s LVOT Vmean:  63.500 cm/s LVOT VTI:    0.174 m  AORTA Ao Root diam: 3.40 cm MITRAL VALVE               TRICUSPID VALVE MV Area (PHT): 2.46 cm    TR Peak grad:   41.7 mmHg MV Decel Time: 308 msec    TR Vmax:        323.00 cm/s MV E velocity: 33.80 cm/s MV A velocity: 54.70 cm/s  SHUNTS MV E/A ratio:  0.62        Systemic VTI:  0.17 m                            Systemic Diam: 1.80 cm Nona Dell MD Electronically  signed by Nona DellSamuel Mcdowell MD Signature Date/Time: 12/29/2019/5:03:52 PM    Final         Scheduled Meds: . carbidopa-levodopa  1 tablet Oral Q3H  . Chlorhexidine Gluconate Cloth  6 each Topical Daily  . sodium chloride flush  3 mL Intravenous Q12H  . thyroid  30 mg Oral Daily   Continuous Infusions: . sodium chloride    . heparin 1,500 Units/hr (12/29/19 2047)     LOS: 2 days    Time spent: 35 minutes    Gary Sellers D Sherryll BurgerShah, DO [facility]  If 7PM-7AM, please contact night-coverage [email or web address] 12/30/2019, 9:18 AM

## 2019-12-30 NOTE — Care Management Important Message (Signed)
Important Message  Patient Details  Name: Gary Sellers MRN: [id number] Date of Birth: 1935/01/02   Medicare Important Message Given:  Yes     Corey Harold 12/30/2019, 1:49 PM

## 2019-12-31 LAB — BASIC METABOLIC PANEL
Anion gap: 7 (ref 5–15)
BUN: 24 mg/dL — ABNORMAL HIGH (ref 8–23)
CO2: 23 mmol/L (ref 22–32)
Calcium: 8.6 mg/dL — ABNORMAL LOW (ref 8.9–10.3)
Chloride: 106 mmol/L (ref 98–111)
Creatinine, Ser: 0.99 mg/dL (ref 0.61–1.24)
GFR calc Af Amer: >60 mL/min (ref >60)
GFR calc non Af Amer: >60 mL/min (ref >60)
Glucose, Bld: 112 mg/dL — ABNORMAL HIGH (ref 70–99)
Potassium: 3.9 mmol/L (ref 3.5–5.1)
Sodium: 136 mmol/L (ref 135–145)

## 2019-12-31 LAB — CBC
HCT: 37.4 % — ABNORMAL LOW (ref 39.0–52.0)
Hemoglobin: 12.1 g/dL — ABNORMAL LOW (ref 13.0–17.0)
MCH: 30.7 pg (ref 26.0–34.0)
MCHC: 32.4 g/dL (ref 30.0–36.0)
MCV: 94.9 fL (ref 80.0–100.0)
Platelets: 209 10*3/uL (ref 150–400)
RBC: 3.94 MIL/uL — ABNORMAL LOW (ref 4.22–5.81)
RDW: 13.1 % (ref 11.5–15.5)
WBC: 8.2 10*3/uL (ref 4.0–10.5)
nRBC: 0 % (ref 0.0–0.2)

## 2019-12-31 LAB — MAGNESIUM: Magnesium: 2 mg/dL (ref 1.7–2.4)

## 2019-12-31 LAB — HEPARIN LEVEL (UNFRACTIONATED): Heparin Unfractionated: 0.53 IU/mL (ref 0.30–0.70)

## 2019-12-31 NOTE — Plan of Care (Signed)
  Problem: Education: Goal: Knowledge of General Education information will improve Description: Including pain rating scale, medication(s)/side effects and non-pharmacologic comfort measures Outcome: Progressing   Problem: Activity: Goal: Risk for activity intolerance will decrease Outcome: Progressing   Problem: Pain Managment: Goal: General experience of comfort will improve Outcome: Progressing   

## 2019-12-31 NOTE — Progress Notes (Signed)
PROGRESS NOTE    Gary CaperWilliam Sellers  ZOX:[id number] DOB: 09/08/1934 DOA: 12/28/2019 PCP: System, Provider Not In   Brief Narrative:  Per HPI: Gary KitchensWilliam Sellers a 85 y.o.malewith medical history significant forParkinson disease with deep brain stimulator, hypothyroidism, and GERD who presented via EMS after he was noted to have significant lethargy and weakness as well as progressive shortness of breath. His symptoms have progressed over the course of 2 weeks and appears to be of gradual onset. [facility] for evaluation of his symptoms and everything came back normal with the exception of an elevated blood glucose level and so he was sent home. EMS was called after he slumped to the ground and was noted to have some stool incontinence. EMS reported room oxygen saturation of approximately 84% which increased to 95% on 4 L. He was also noted to have some initial soft blood pressure readings with systolics in the 90s. Patient has recent poor oral intake, but denies any abdominal pain, chest pain, fever, chills, cough, nausea, vomiting, or diarrhea.  9/10: Patient has been admitted with acute hypoxemic respiratory failure in the setting of submassive PE and has been started on heparin drip.  2D echocardiogram with LVEF 55-60% and signs of right ventricular overload noted.  He has been noted to have significant agitation overnight requiring Haldol.  9/11: Patient seen and evaluated this morning with no agitation noted overnight.  He currently remains on heparin drip with stable blood pressure readings noted.  Assessment & Plan:   Active Problems:   PE (pulmonary thromboembolism) (HCC)   Acute hypoxemic respiratory failure secondary to submassive PE -Noted RV strain on imaging -Pulmonology to consult them continue following; no need for TPA or transfer noted -Continue IV heparin drip at high end of therapeutic level for 5 days and then discharge with  anticoagulation once stable, currently day 3/5 -Plan for heme/onc follow up outpatient to assess for hypercoagulability; will obtain panel while here  Elevated troponin likely secondary to RV strain above -No chest pain noted -EKG without any concerning findings -2D echocardiogram reviewed with LVEF 55-60% and no wall motion abnormalities.  RV strain noted. -Continue heparin drip  CKDIIIa -Currently at baseline creatinine around 1.1-1.2 -Continue to monitor  History of parkinsonism -Continue Sinemet  History of GERD -Continue PPI  History of hypothyroidism -Continue home medication -TSH 2.7   DVT prophylaxis: Heparin drip Code Status: Full code Family Communication: Discussed with daughter on phone 9/10 Disposition Plan:  Status is: Inpatient  Remains inpatient appropriate because:IV treatments appropriate due to intensity of illness or inability to take PO and Inpatient level of care appropriate due to severity of illness   Dispo: The patient is from: Home  Anticipated d/c is to: Home  Anticipated d/c date is: > 3 days  Patient currently is not medically stable to d/c.  Patient requires ongoing IV heparin treatment for submassive PE.  Consultants:   PCCM  Procedures:   See below  2D echocardiogram 9/9 with LVEF 55-60% and RV overload  Antimicrobials:   None  Subjective: Patient seen and evaluated today with no new acute complaints or concerns. No acute concerns or events noted overnight.  Objective: Vitals:   12/31/19 0400 12/31/19 0500 12/31/19 0600 12/31/19 0752  BP: (!) 88/50 (!) 82/47 105/64   Pulse: 61 60 66 64  Resp: 20 (!) 21 (!) 22 20  Temp:    98.1 F (36.7 C)  TempSrc:    Oral  SpO2: 98% 98% 97%  99%  Weight:      Height:        Intake/Output Summary (Last 24 hours) at 12/31/2019 1136 Last data filed at 12/31/2019 0100 Gross per 24 hour  Intake 620 ml  Output 700 ml  Net -80 ml   Filed  Weights   12/29/19 2200 12/30/19 0500 12/31/19 0200  Weight: 68.8 kg 65.8 kg 67.9 kg    Examination:  General exam: Appears calm and comfortable /somnolent Respiratory system: Clear to auscultation. Respiratory effort normal. On 5L [place]. Cardiovascular system: S1 & S2 heard, RRR. Gastrointestinal system: Abdomen is nondistended, soft and nontender.  Central nervous system: Alert and oriented. No focal neurological deficits. Extremities: Symmetric 5 x 5 power. Skin: No rashes, lesions or ulcers Psychiatry: Cannot be assessed.    Data Reviewed: I have personally reviewed following labs and imaging studies  CBC: Recent Labs  Lab 12/28/19 1040 12/29/19 0346 12/30/19 0526 12/31/19 0606  WBC 13.2* 13.0* 8.1 8.2  HGB 12.8* 12.0* 11.7* 12.1*  HCT 41.0 37.2* 37.2* 37.4*  MCV 95.1 94.4 95.9 94.9  PLT 201 185 187 209   Basic Metabolic Panel: Recent Labs  Lab 12/28/19 1040 12/30/19 0526 12/31/19 0606  NA 137 138 136  K 4.6 3.8 3.9  CL 104 108 106  CO2 22 21* 23  GLUCOSE 166* 102* 112*  BUN 29* 27* 24*  CREATININE 1.19 1.21 0.99  CALCIUM 8.9 8.8* 8.6*  MG  --  2.1 2.0   GFR: Estimated Creatinine Clearance: 54.3 mL/min (by C-G formula based on SCr of 0.99 mg/dL). Liver Function Tests: Recent Labs  Lab 12/28/19 1040  AST 20  ALT <5  ALKPHOS 71  BILITOT 0.9  PROT 6.1*  ALBUMIN 3.5   No results for input(s): LIPASE, AMYLASE in the last 168 hours. No results for input(s): AMMONIA in the last 168 hours. Coagulation Profile: No results for input(s): INR, PROTIME in the last 168 hours. Cardiac Enzymes: No results for input(s): CKTOTAL, CKMB, CKMBINDEX, TROPONINI in the last 168 hours. BNP (last 3 results) No results for input(s): PROBNP in the last 8760 hours. HbA1C: No results for input(s): HGBA1C in the last 72 hours. CBG: No results for input(s): GLUCAP in the last 168 hours. Lipid Profile: No results for input(s): CHOL, HDL, LDLCALC, TRIG, CHOLHDL, LDLDIRECT in  the last 72 hours. Thyroid Function Tests: Recent Labs    12/29/19 0346  TSH 2.796   Anemia Panel: No results for input(s): VITAMINB12, FOLATE, FERRITIN, TIBC, IRON, RETICCTPCT in the last 72 hours. Sepsis Labs: Recent Labs  Lab 12/28/19 1040 12/28/19 1456  LATICACIDVEN 2.1* 1.4    Recent Results (from the past 240 hour(s))  SARS Coronavirus 2 by RT PCR (hospital order, performed in [facility] hospital lab) Nasopharyngeal Nasopharyngeal Swab     Status: None   Collection Time: 12/28/19  8:02 PM   Specimen: Nasopharyngeal Swab  Result Value Ref Range Status   SARS Coronavirus 2 NEGATIVE NEGATIVE Final    Comment: (NOTE) SARS-CoV-2 target nucleic acids are NOT DETECTED.  The SARS-CoV-2 RNA is generally detectable in upper and lower respiratory specimens during the acute phase of infection. The lowest concentration of SARS-CoV-2 viral copies this assay can detect is 250 copies / mL. A negative result does not preclude SARS-CoV-2 infection and should not be used as the sole basis for treatment or other patient management decisions.  A negative result may occur with improper specimen collection / handling, submission of specimen other than nasopharyngeal swab, presence of  viral mutation(s) within the areas targeted by this assay, and inadequate number of viral copies (<250 copies / mL). A negative result must be combined with clinical observations, patient history, and epidemiological information.  Fact Sheet for Patients:   BoilerBrush.com.cy  Fact Sheet for Healthcare Providers: https://pope.com/  This test is not yet approved or  cleared by the [place] FDA and has been authorized for detection and/or diagnosis of SARS-CoV-2 by FDA under an Emergency Use Authorization (EUA).  This EUA will remain in effect (meaning this test can be used) for the duration of the COVID-19 declaration under Section 564(b)(1) of the Act,  21 U.S.C. section 360bbb-3(b)(1), unless the authorization is terminated or revoked sooner.  Performed at [facility], [address], [place], [place] [address]   MRSA PCR Screening     Status: None   Collection Time: 12/29/19 10:21 PM   Specimen: Nasal Mucosa; Nasopharyngeal  Result Value Ref Range Status   MRSA by PCR NEGATIVE NEGATIVE Final    Comment:        The GeneXpert MRSA Assay (FDA approved for NASAL specimens only), is one component of a comprehensive MRSA colonization surveillance program. It is not intended to diagnose MRSA infection nor to guide or monitor treatment for MRSA infections. Performed at [facility], [address], [place], [place] [address]          Radiology Studies: ECHOCARDIOGRAM COMPLETE  Result Date: 12/29/2019    ECHOCARDIOGRAM REPORT   Patient Name:   JACORY KAMEL Date of Exam: 12/29/2019 Medical Rec #:  [id number]      Height:       70.0 in Accession #:    [id number]     Weight:       154.3 lb Date of Birth:  Apr 19, 1935       BSA:          1.870 m Patient Age:    85 years       BP:           111/75 mmHg Patient Gender: M              HR:           78 bpm. [facility] Procedure: 2D Echo, Cardiac Doppler and Color Doppler Indications:    Pulmonary Embolus 415.19 / I26.99  History:        Patient has no prior history of Echocardiogram examinations.                 (From Hx) Dementia & Parkinson disease.  Sonographer:    Celesta Gentile RCS Referring Phys: [id number] Lamont Dowdy [facility]  Sonographer Comments: Patient unawear of his surroundings. "Fought" with patient entire exam. Best I could obtain. IMPRESSIONS  1. Left ventricular ejection fraction, by estimation, is 55 to 60%. The left ventricle has normal function. The left ventricle has no regional wall motion abnormalities. Left ventricular diastolic parameters are indeterminate. There is the interventricular septum is flattened in diastole ('D' shaped left ventricle), consistent with right  ventricular volume overload.  2. McConnell's sign consistent with diagnosis of pulmonary embolus. Right ventricular systolic function is severely reduced. The right ventricular size is moderately enlarged. There is moderately elevated pulmonary artery systolic pressure. The estimated right ventricular systolic pressure is 56.7 mmHg.  3. Right atrial size was upper normal.  4. The mitral valve is grossly normal. Trivial mitral valve regurgitation.  5. Tricuspid valve regurgitation is mild to moderate.  6. The aortic valve  is tricuspid. Aortic valve regurgitation is not visualized.  7. The inferior vena cava is dilated in size with <50% respiratory variability, suggesting right atrial pressure of 15 mmHg. FINDINGS  Left Ventricle: Left ventricular ejection fraction, by estimation, is 55 to 60%. The left ventricle has normal function. The left ventricle has no regional wall motion abnormalities. The left ventricular internal cavity size was normal in size. There is  borderline left ventricular hypertrophy. The interventricular septum is flattened in diastole ('D' shaped left ventricle), consistent with right ventricular volume overload. Left ventricular diastolic parameters are indeterminate. Right Ventricle: McConnell's sign consistent with diagnosis of pulmonary embolus. The right ventricular size is moderately enlarged. No increase in right ventricular wall thickness. Right ventricular systolic function is severely reduced. There is moderately elevated pulmonary artery systolic pressure. The tricuspid regurgitant velocity is 3.23 m/s, and with an assumed right atrial pressure of 15 mmHg, the estimated right ventricular systolic pressure is 56.7 mmHg. Left Atrium: Left atrial size was normal in size. Right Atrium: Right atrial size was upper normal. Pericardium: There is no evidence of pericardial effusion. Mitral Valve: The mitral valve is grossly normal. Trivial mitral valve regurgitation. Tricuspid Valve: The  tricuspid valve is grossly normal. Tricuspid valve regurgitation is mild to moderate. Aortic Valve: The aortic valve is tricuspid. There is mild aortic valve annular calcification. Aortic valve regurgitation is not visualized. Pulmonic Valve: The pulmonic valve was not well visualized. Pulmonic valve regurgitation is trivial. Aorta: The aortic root is normal in size and structure. Venous: The inferior vena cava is dilated in size with less than 50% respiratory variability, suggesting right atrial pressure of 15 mmHg. IAS/Shunts: No atrial level shunt detected by color flow Doppler.  LEFT VENTRICLE PLAX 2D LVIDd:         3.82 cm  Diastology LVIDs:         2.72 cm  LV e' medial:    5.00 cm/s LV PW:         1.12 cm  LV E/e' medial:  6.8 LV IVS:        0.88 cm  LV e' lateral:   9.46 cm/s LVOT diam:     1.80 cm  LV E/e' lateral: 3.6 LV SV:         44 LV SV Index:   24 LVOT Area:     2.54 cm  RIGHT VENTRICLE RV S prime:     10.60 cm/s TAPSE (M-mode): 1.8 cm LEFT ATRIUM             Index       RIGHT ATRIUM           Index LA diam:        3.00 cm 1.60 cm/m  RA Area:     20.10 cm LA Vol (A2C):   48.6 ml 26.00 ml/m RA Volume:   60.60 ml  32.41 ml/m LA Vol (A4C):   53.0 ml 28.35 ml/m LA Biplane Vol: 53.2 ml 28.46 ml/m  AORTIC VALVE LVOT Vmax:   84.40 cm/s LVOT Vmean:  63.500 cm/s LVOT VTI:    0.174 m  AORTA Ao Root diam: 3.40 cm MITRAL VALVE               TRICUSPID VALVE MV Area (PHT): 2.46 cm    TR Peak grad:   41.7 mmHg MV Decel Time: 308 msec    TR Vmax:        323.00 cm/s MV E velocity: 33.80 cm/s MV A velocity: 54.70  cm/s  SHUNTS MV E/A ratio:  0.62        Systemic VTI:  0.17 m                            Systemic Diam: 1.80 cm Nona Dell MD Electronically signed by Nona Dell MD Signature Date/Time: 12/29/2019/5:03:52 PM    Final         Scheduled Meds: . carbidopa-levodopa  1 tablet Oral Q3H  . Chlorhexidine Gluconate Cloth  6 each Topical Daily  . sodium chloride flush  3 mL Intravenous Q12H    . thyroid  30 mg Oral Daily   Continuous Infusions: . sodium chloride    . heparin 1,500 Units/hr (12/31/19 0502)     LOS: 3 days    Time spent: 30 minutes    Mylo Driskill D Sherryll Burger, DO [facility]  If 7PM-7AM, please contact night-coverage [email or web address] 12/31/2019, 11:36 AM

## 2019-12-31 NOTE — Progress Notes (Signed)
ANTICOAGULATION CONSULT NOTE   Pharmacy Consult for Heparin Indication: pulmonary embolus  No Known Allergies  Patient Measurements: Height: 5\' 11"  (180.3 cm) Weight: 67.9 kg (149 lb 11.1 oz) IBW/kg (Calculated) : 75.3 Heparin Dosing Weight:  70kg  Vital Signs: Temp: 98.1 F (36.7 C) (09/11 0752) Temp Source: Oral (09/11 0752) BP: 105/64 (09/11 0600) Pulse Rate: 64 (09/11 0752)  Labs: Recent Labs    12/28/19 1040 12/28/19 1040 12/28/19 2100 12/28/19 2259 12/29/19 0346 12/29/19 0346 12/29/19 1113 12/29/19 1900 12/30/19 0526 12/31/19 0606  HGB 12.8*   < >  --   --  12.0*   < >  --   --  11.7* 12.1*  HCT 41.0   < >  --   --  37.2*  --   --   --  37.2* 37.4*  PLT 201   < >  --   --  185  --   --   --  187 209  HEPARINUNFRC  --   --   --   --  0.45  --    < > 0.31 0.57 0.53  CREATININE 1.19  --   --   --   --   --   --   --  1.21 0.99  TROPONINIHS  --   --  297* 307*  --   --   --   --   --   --    < > = values in this interval not displayed.    Estimated Creatinine Clearance: 54.3 mL/min (by C-G formula based on SCr of 0.99 mg/dL).   Medical History: Past Medical History:  Diagnosis Date  . Dementia (HCC)   . Parkinson disease (HCC)     Assessment: Start IV heparin for new PE.03/01/20 Baseline Hgb 12.8 Plts 201.Start IV heparin for new PE. - 9/8 CT: Positive for acute bilateral pulmonary emboli with CT evidence of right heart strain (RV/LV Ratio = 1.8) consistent with at least submassive (intermediate risk) PE.   -heparin level at goal on 1500 units/hr  Goal of Therapy:  Heparin level 0.5-0.7 per MD Monitor platelets by anticoagulation protocol: Yes   Plan:  Continue heparin at 1500 units/hr Daily heparin level and CBC  04-25-1976, PharmD Clinical Pharmacist 12/31/2019 8:29 AM

## 2020-01-01 LAB — ANTITHROMBIN III: AntiThromb III Func: 74 % — ABNORMAL LOW (ref 75–120)

## 2020-01-01 LAB — CBC
HCT: 39.9 % (ref 39.0–52.0)
Hemoglobin: 12.5 g/dL — ABNORMAL LOW (ref 13.0–17.0)
MCH: 29.7 pg (ref 26.0–34.0)
MCHC: 31.3 g/dL (ref 30.0–36.0)
MCV: 94.8 fL (ref 80.0–100.0)
Platelets: 256 10*3/uL (ref 150–400)
RBC: 4.21 MIL/uL — ABNORMAL LOW (ref 4.22–5.81)
RDW: 12.7 % (ref 11.5–15.5)
WBC: 10.4 10*3/uL (ref 4.0–10.5)
nRBC: 0 % (ref 0.0–0.2)

## 2020-01-01 LAB — BASIC METABOLIC PANEL
Anion gap: 8 (ref 5–15)
BUN: 23 mg/dL (ref 8–23)
CO2: 26 mmol/L (ref 22–32)
Calcium: 8.9 mg/dL (ref 8.9–10.3)
Chloride: 102 mmol/L (ref 98–111)
Creatinine, Ser: 0.94 mg/dL (ref 0.61–1.24)
GFR calc Af Amer: >60 mL/min (ref >60)
GFR calc non Af Amer: >60 mL/min (ref >60)
Glucose, Bld: 118 mg/dL — ABNORMAL HIGH (ref 70–99)
Potassium: 4 mmol/L (ref 3.5–5.1)
Sodium: 136 mmol/L (ref 135–145)

## 2020-01-01 LAB — MAGNESIUM: Magnesium: 2.1 mg/dL (ref 1.7–2.4)

## 2020-01-01 LAB — HEPARIN LEVEL (UNFRACTIONATED): Heparin Unfractionated: 0.47 IU/mL (ref 0.30–0.70)

## 2020-01-01 MED ORDER — POLYETHYLENE GLYCOL 3350 17 G PO PACK
17.0000 g | PACK | Freq: Every day | ORAL | Status: DC | PRN
  Administered 2020-01-01: 17 g via ORAL
  Filled 2020-01-01 (×2): qty 1

## 2020-01-01 NOTE — Progress Notes (Addendum)
ANTICOAGULATION CONSULT NOTE   Pharmacy Consult for Heparin Indication: pulmonary embolus  No Known Allergies  Patient Measurements: Height: 5\' 11"  (180.3 cm) Weight: 67.9 kg (149 lb 11.1 oz) IBW/kg (Calculated) : 75.3 Heparin Dosing Weight:  70kg  Vital Signs: Temp: 97.8 F (36.6 C) (09/12 0740) Temp Source: Oral (09/12 0740) BP: 120/77 (09/12 0800) Pulse Rate: 76 (09/12 0800)  Labs: Recent Labs    12/30/19 0526 12/30/19 0526 12/31/19 0606 01/01/20 0653  HGB 11.7*   < > 12.1* 12.5*  HCT 37.2*  --  37.4* 39.9  PLT 187  --  209 256  HEPARINUNFRC 0.57  --  0.53 0.47  CREATININE 1.21  --  0.99 0.94   < > = values in this interval not displayed.    Estimated Creatinine Clearance: 57.2 mL/min (by C-G formula based on SCr of 0.94 mg/dL).   Medical History: Past Medical History:  Diagnosis Date  . Dementia (HCC)   . Parkinson disease (HCC)     Assessment: Start IV heparin for new PE.03/02/20 Baseline Hgb 12.8 Plts 201.Start IV heparin for new PE. - 9/8 CT: Positive for acute bilateral pulmonary emboli with CT evidence of right heart strain (RV/LV Ratio = 1.8) consistent with at least submassive (intermediate risk) PE.   01-01-20 update Heparin level: 0.47 IU/mL  on heparin at 1500 units/hr CBC: Hb: 12.5 (stable)  Plates 09-29-1982 RN reports no bleeding complications or issues with infusion site  Goal of Therapy:  Heparin level 0.5-0.7 per MD Monitor platelets by anticoagulation protocol: Yes   Plan:  Continue heparin at 1500 units/hr Daily heparin level and CBC  195>093>267, Pharm. D. Clinical Pharmacist 01/01/2020 9:38 AM

## 2020-01-01 NOTE — Progress Notes (Signed)
PROGRESS NOTE    Gary Sellers  YQM:[id number] DOB: 03-02-1935 DOA: 12/28/2019 PCP: System, Provider Not In   Brief Narrative:  Per HPI: Gary Sellers a 85 y.o.malewith medical history significant forParkinson disease with deep brain stimulator, hypothyroidism, and GERD who presented via EMS after he was noted to have significant lethargy and weakness as well as progressive shortness of breath. His symptoms have progressed over the course of 2 weeks and appears to be of gradual onset. He reportedly went to [facility] emergency department for evaluation of his symptoms and everything came back normal with the exception of an elevated blood glucose level and so he was sent home. EMS was called after he slumped to the ground and was noted to have some stool incontinence. EMS reported room oxygen saturation of approximately 84% which increased to 95% on 4 L. He was also noted to have some initial soft blood pressure readings with systolics in the 90s. Patient has recent poor oral intake, but denies any abdominal pain, chest pain, fever, chills, cough, nausea, vomiting, or diarrhea.  9/10:Patient has been admitted with acute hypoxemic respiratory failure in the setting of submassive PE and has been started on heparin drip. 2D echocardiogram with LVEF 55-60% and signs of right ventricular overload noted.He has been noted to have significant agitation overnight requiring Haldol.  9/11: Patient seen and evaluated this morning with no agitation noted overnight.  He currently remains on heparin drip with stable blood pressure readings noted.  9/12: Patient has done well overnight and is stable for transfer to telemetry today.  Continue heparin drip as ordered through today.  Plan for PT evaluation with potential need for SNF.  Assessment & Plan:   Active Problems:   PE (pulmonary thromboembolism) (HCC)   Acute hypoxemic respiratory failure secondary to submassive PE -Noted RV strain on  imaging -Pulmonology to consult them continue following; no need for TPA or transfer noted -Continue IV heparin drip at high end of therapeutic level for 5 days and then discharge with anticoagulation once stable, currently day 4/5 -Plan for heme/onc follow up outpatient to assess for hypercoagulability; will obtain panel while here  Elevated troponin likely secondary to RV strain above -No chest pain noted -EKG without any concerning findings -2D echocardiogram reviewed with LVEF 55-60% and no wall motion abnormalities. RV strain noted. -Continue heparin drip -Okay for transfer to telemetry  CKDIIIa -Currently at baseline creatinine around 1.1-1.2 -Continue to monitor  History of parkinsonism -Continue Sinemet  History of GERD -Continue PPI  History of hypothyroidism -Continue home medication -TSH 2.7   DVT prophylaxis:Heparin drip Code Status:Full code Family Communication:Discussed with sister, [place] at bedside 9/12 Disposition Plan: Status is: Inpatient  Remains inpatient appropriate because:IV treatments appropriate due to intensity of illness or inability to take PO and Inpatient level of care appropriate due to severity of illness   Dispo: The patient is from:Home Anticipated d/c is BM:WUXL/KGM Anticipated d/c date is: 1-2 days Patient currently is not medically stable to d/c.Patient requires ongoing IV heparin treatment for submassive PE.  Consultants:  PCCM  Procedures:  See below  2D echocardiogram 9/9 with LVEF 55-60% and RV overload  Antimicrobials:   None  Subjective: Patient seen and evaluated today with no new acute complaints or concerns. No acute concerns or events noted overnight.  Objective: Vitals:   01/01/20 0600 01/01/20 0700 01/01/20 0740 01/01/20 0800  BP: 106/66 118/65  120/77  Pulse: 73 74 69 76  Resp: 18 17 16  (!) 21  Temp:  97.8 F (36.6 C)   TempSrc:   Oral    SpO2: 94% 93% 94% 90%  Weight:      Height:        Intake/Output Summary (Last 24 hours) at 01/01/2020 1141 Last data filed at 01/01/2020 1100 Gross per 24 hour  Intake 1000.75 ml  Output 5225 ml  Net -4224.25 ml   Filed Weights   12/29/19 2200 12/30/19 0500 12/31/19 0200  Weight: 68.8 kg 65.8 kg 67.9 kg    Examination:  General exam: Appears calm and comfortable  Respiratory system: Clear to auscultation. Respiratory effort normal.  Currently on 3 L nasal cannula oxygen. Cardiovascular system: S1 & S2 heard, RRR. Gastrointestinal system: Abdomen is nondistended, soft and nontender.  Central nervous system: Alert and oriented. No focal neurological deficits. Extremities: Symmetric 5 x 5 power. Skin: No rashes, lesions or ulcers Psychiatry: Judgement and insight appear normal. Mood & affect appropriate.     Data Reviewed: I have personally reviewed following labs and imaging studies  CBC: Recent Labs  Lab 12/28/19 1040 12/29/19 0346 12/30/19 0526 12/31/19 0606 01/01/20 0653  WBC 13.2* 13.0* 8.1 8.2 10.4  HGB 12.8* 12.0* 11.7* 12.1* 12.5*  HCT 41.0 37.2* 37.2* 37.4* 39.9  MCV 95.1 94.4 95.9 94.9 94.8  PLT 201 185 187 209 256   Basic Metabolic Panel: Recent Labs  Lab 12/28/19 1040 12/30/19 0526 12/31/19 0606 01/01/20 0653  NA 137 138 136 136  K 4.6 3.8 3.9 4.0  CL 104 108 106 102  CO2 22 21* 23 26  GLUCOSE 166* 102* 112* 118*  BUN 29* 27* 24* 23  CREATININE 1.19 1.21 0.99 0.94  CALCIUM 8.9 8.8* 8.6* 8.9  MG  --  2.1 2.0 2.1   GFR: Estimated Creatinine Clearance: 57.2 mL/min (by C-G formula based on SCr of 0.94 mg/dL). Liver Function Tests: Recent Labs  Lab 12/28/19 1040  AST 20  ALT <5  ALKPHOS 71  BILITOT 0.9  PROT 6.1*  ALBUMIN 3.5   No results for input(s): LIPASE, AMYLASE in the last 168 hours. No results for input(s): AMMONIA in the last 168 hours. Coagulation Profile: No results for input(s): INR, PROTIME in the last 168  hours. Cardiac Enzymes: No results for input(s): CKTOTAL, CKMB, CKMBINDEX, TROPONINI in the last 168 hours. BNP (last 3 results) No results for input(s): PROBNP in the last 8760 hours. HbA1C: No results for input(s): HGBA1C in the last 72 hours. CBG: No results for input(s): GLUCAP in the last 168 hours. Lipid Profile: No results for input(s): CHOL, HDL, LDLCALC, TRIG, CHOLHDL, LDLDIRECT in the last 72 hours. Thyroid Function Tests: No results for input(s): TSH, T4TOTAL, FREET4, T3FREE, THYROIDAB in the last 72 hours. Anemia Panel: No results for input(s): VITAMINB12, FOLATE, FERRITIN, TIBC, IRON, RETICCTPCT in the last 72 hours. Sepsis Labs: Recent Labs  Lab 12/28/19 1040 12/28/19 1456  LATICACIDVEN 2.1* 1.4    Recent Results (from the past 240 hour(s))  SARS Coronavirus 2 by RT PCR (hospital order, performed in [facility] [place] [facility] hospital lab) Nasopharyngeal Nasopharyngeal Swab     Status: None   Collection Time: 12/28/19  8:02 PM   Specimen: Nasopharyngeal Swab  Result Value Ref Range Status   SARS Coronavirus 2 NEGATIVE NEGATIVE Final    Comment: (NOTE) SARS-CoV-2 target nucleic acids are NOT DETECTED.  The SARS-CoV-2 RNA is generally detectable in upper and lower respiratory specimens during the acute phase of infection. The lowest concentration of SARS-CoV-2 viral copies this assay can detect is 250  copies / mL. A negative result does not preclude SARS-CoV-2 infection and should not be used as the sole basis for treatment or other patient management decisions.  A negative result may occur with improper specimen collection / handling, submission of specimen other than nasopharyngeal swab, presence of viral mutation(s) within the areas targeted by this assay, and inadequate number of viral copies (<250 copies / mL). A negative result must be combined with clinical observations, patient history, and epidemiological information.  Fact Sheet for Patients:    BoilerBrush.com.cy  Fact Sheet for Healthcare Providers: https://pope.com/  This test is not yet approved or  cleared by the [place] FDA and has been authorized for detection and/or diagnosis of SARS-CoV-2 by FDA under an Emergency Use Authorization (EUA).  This EUA will remain in effect (meaning this test can be used) for the duration of the COVID-19 declaration under Section 564(b)(1) of the Act, 21 U.S.C. section 360bbb-3(b)(1), unless the authorization is terminated or revoked sooner.  Performed at [facility], [address], [place], [place] [address]   MRSA PCR Screening     Status: None   Collection Time: 12/29/19 10:21 PM   Specimen: Nasal Mucosa; Nasopharyngeal  Result Value Ref Range Status   MRSA by PCR NEGATIVE NEGATIVE Final    Comment:        The GeneXpert MRSA Assay (FDA approved for NASAL specimens only), is one component of a comprehensive MRSA colonization surveillance program. It is not intended to diagnose MRSA infection nor to guide or monitor treatment for MRSA infections. Performed at [facility], [address], [place], [place] [address]          Radiology Studies: No results found.      Scheduled Meds: . carbidopa-levodopa  1 tablet Oral Q3H  . Chlorhexidine Gluconate Cloth  6 each Topical Daily  . sodium chloride flush  3 mL Intravenous Q12H  . thyroid  30 mg Oral Daily   Continuous Infusions: . sodium chloride    . heparin 1,500 Units/hr (12/31/19 2154)     LOS: 4 days    Time spent: 30 minutes    Trini Christiansen Hoover Brunette, DO [facility]  If 7PM-7AM, please contact night-coverage [email or web address] 01/01/2020, 11:41 AM

## 2020-01-02 LAB — CBC
HCT: 40.4 % (ref 39.0–52.0)
Hemoglobin: 12.8 g/dL — ABNORMAL LOW (ref 13.0–17.0)
MCH: 30.2 pg (ref 26.0–34.0)
MCHC: 31.7 g/dL (ref 30.0–36.0)
MCV: 95.3 fL (ref 80.0–100.0)
Platelets: 252 10*3/uL (ref 150–400)
RBC: 4.24 MIL/uL (ref 4.22–5.81)
RDW: 12.8 % (ref 11.5–15.5)
WBC: 9.9 10*3/uL (ref 4.0–10.5)
nRBC: 0 % (ref 0.0–0.2)

## 2020-01-02 LAB — BASIC METABOLIC PANEL
Anion gap: 10 (ref 5–15)
BUN: 24 mg/dL — ABNORMAL HIGH (ref 8–23)
CO2: 26 mmol/L (ref 22–32)
Calcium: 9.3 mg/dL (ref 8.9–10.3)
Chloride: 103 mmol/L (ref 98–111)
Creatinine, Ser: 0.98 mg/dL (ref 0.61–1.24)
GFR calc Af Amer: >60 mL/min (ref >60)
GFR calc non Af Amer: >60 mL/min (ref >60)
Glucose, Bld: 102 mg/dL — ABNORMAL HIGH (ref 70–99)
Potassium: 3.9 mmol/L (ref 3.5–5.1)
Sodium: 139 mmol/L (ref 135–145)

## 2020-01-02 LAB — BETA-2-GLYCOPROTEIN I ABS, IGG/M/A
Beta-2 Glyco I IgG: <9 GPI IgG units (ref 0–20)
Beta-2-Glycoprotein I IgA: <9 GPI IgA units (ref 0–25)
Beta-2-Glycoprotein I IgM: <9 GPI IgM units (ref 0–32)

## 2020-01-02 LAB — HOMOCYSTEINE: Homocysteine: 10.9 umol/L (ref 0.0–21.3)

## 2020-01-02 LAB — MAGNESIUM: Magnesium: 2.2 mg/dL (ref 1.7–2.4)

## 2020-01-02 LAB — HEPARIN LEVEL (UNFRACTIONATED): Heparin Unfractionated: 0.6 IU/mL (ref 0.30–0.70)

## 2020-01-02 MED ORDER — APIXABAN 5 MG PO TABS: 5.0000 mg | ORAL_TABLET | Freq: Two times a day (BID) | ORAL | Status: DC

## 2020-01-02 MED ORDER — APIXABAN 5 MG PO TABS
10.0000 mg | ORAL_TABLET | Freq: Two times a day (BID) | ORAL | Status: DC
  Administered 2020-01-02 – 2020-01-04 (×5): 10 mg via ORAL
  Filled 2020-01-02 (×6): qty 2

## 2020-01-02 NOTE — Progress Notes (Addendum)
ANTICOAGULATION CONSULT NOTE   Pharmacy Consult for Heparin-->apixaban Indication: pulmonary embolus--  No Known Allergies  Patient Measurements: Height: 5\' 11"  (180.3 cm) Weight: 64.9 kg (143 lb 1.3 oz) IBW/kg (Calculated) : 75.3 Heparin Dosing Weight:  70kg  Vital Signs: Temp: 97.8 F (36.6 C) (09/13 0815) Temp Source: Axillary (09/13 0815) BP: 115/65 (09/13 0400) Pulse Rate: 70 (09/13 1000)  Labs: Recent Labs    12/31/19 0606 12/31/19 0606 01/01/20 0653 01/02/20 0555  HGB 12.1*   < > 12.5* 12.8*  HCT 37.4*  --  39.9 40.4  PLT 209  --  256 252  HEPARINUNFRC 0.53  --  0.47 0.60  CREATININE 0.99  --  0.94 0.98   < > = values in this interval not displayed.    Estimated Creatinine Clearance: 52.4 mL/min (by C-G formula based on SCr of 0.98 mg/dL).   Medical History: Past Medical History:  Diagnosis Date  . Dementia (HCC)   . Parkinson disease (HCC)     Assessment: Start IV heparin for new PE.01/04/20 Baseline Hgb 12.8 Plts 201.Start IV heparin for new PE. - 9/8 CT: Positive for acute bilateral pulmonary emboli with CT evidence of right heart strain (RV/LV Ratio = 1.8) consistent with at least submassive (intermediate risk) PE.   01-02-20 update Heparin level: 0.60 IU/mL-->within goal range  on heparin at 1500 units/hr CBC: Hb: 12.8 (stable)  Plates 09-16-1992 RN reports no bleeding complications or issues with infusion site  Goal of Therapy:   Monitor platelets by anticoagulation protocol: Yes   Plan:  Discontinue heparin infusion Start apixaban 10mg  bid x7 days, then apixban 5mg  bid    Educate  patient and provide coupon assistance.  [phone], Pharm. D. Clinical Pharmacist 01/02/2020 11:03 AM

## 2020-01-02 NOTE — Evaluation (Addendum)
Physical Therapy Evaluation Patient Details Name: Gary Sellers MRN: [id number] DOB: 10-28-1934 Today's Date: 01/02/2020   History of Present Illness  Gary Sellers is a 85 y.o. male with medical history significant for Parkinson disease with deep brain stimulator, hypothyroidism, and GERD who presented via EMS after he was noted to have significant lethargy and weakness as well as progressive shortness of breath.  His symptoms have progressed over the course of 2 weeks and appears to be of gradual onset.  He reportedly went to [facility] emergency department for evaluation of his symptoms and everything came back normal with the exception of an elevated blood glucose level and so he was sent home.  EMS was called after he slumped to the ground and was noted to have some stool incontinence.  EMS reported room oxygen saturation of approximately 84% which increased to 95% on 4 L.  He was also noted to have some initial soft blood pressure readings with systolics in the 90s.  Patient has recent poor oral intake, but denies any abdominal pain, chest pain, fever, chills, cough, nausea, vomiting, or diarrhea.    Clinical Impression  Patient's O2 sat upon entering the room was 100% on 1.5L Gary Sellers, and on RA was 92% so O2 removed. Patient was able to perform supine to sit min/mod assist without LOB or decrease in O2 sat. He tolerated sitting EOB without LOB. He performed sit to stand mod assist RW with VC's to power up from the best using BUE's rather than pulling on the walker. Patient's O2 sat dropped to 82%, so 1.5L O2 Gary Sellers reapplied. O2 increased to 95% on 1.5 LPM. He performed another sit to stand and received VC's to move feet under the RW. The patient proceeded to lean back in the RW, unaware of safety risk. He was able to take a few uncoordinated side steps before sitting in the chair. Patient's O2 was 98% on 1.5L Gary Sellers at EOS. Patient tolerated sitting up in chair with personal care attendant present at bedside after  therapy. PLAN: The patient will continue to benefit from skilled physical therapy services in hospital at recommended venue below in order to improve balance, gait, and ADL's to promote independence in functional activities.       Follow Up Recommendations SNF    Equipment Recommendations  None recommended by PT    Recommendations for Other Services       Precautions / Restrictions Precautions Precautions: Fall;ICD/Pacemaker Restrictions Weight Bearing Restrictions: No      Mobility  Bed Mobility Overal bed mobility: Needs Assistance Bed Mobility: Supine to Sit     Supine to sit: Min assist;Mod assist     General bed mobility comments: excessive VC's to use BUE's in order to scoot to EOB, BUE weakness limiting mobility in transfer  Transfers Overall transfer level: Needs assistance Equipment used: Rolling walker (2 wheeled) Transfers: Sit to/from Stand Sit to Stand: Mod assist            Ambulation/Gait Ambulation/Gait assistance: Mod assist Gait Distance (Feet): 5 Feet Assistive device: Rolling walker (2 wheeled) Gait Pattern/deviations: Step-to pattern;Decreased step length - right;Decreased step length - left;Narrow base of support;Leaning posteriorly        Stairs            Wheelchair Mobility    Modified Rankin (Stroke Patients Only)       Balance Overall balance assessment: Needs assistance Sitting-balance support: Bilateral upper extremity supported;Feet supported Sitting balance-[name] Scale: Fair Sitting balance - Comments: BUE weakness  with occasional VC's to maintain upright posture   Standing balance support: Bilateral upper extremity supported;During functional activity Standing balance-[name] Scale: Poor Standing balance comment: VC's to properly place BLE's under RW; pt leaning back in RW, unaware of safety risk                             Pertinent Vitals/Pain Pain Assessment: No/denies pain    Home Living  Family/patient expects to be discharged to:: Private residence Living Arrangements: Spouse/significant other Available Help at Discharge: Family;Personal care attendant;Available 24 hours/day;Available PRN/intermittently Type of Home: House Home Access: Ramped entrance     Home Layout: One level Home Equipment: Shower seat;Grab bars - tub/shower;Walker - 4 wheels Additional Comments: wife helps with bathing, personal care attendant for driving/shopping, dressing and other ADL's    Prior Function Level of Independence: Needs assistance   Gait / Transfers Assistance Needed: personal care attendant min assist w rollator for transfers and ambulation  ADL's / Homemaking Assistance Needed: help with dressing and bathing        Hand Dominance   Dominant Hand: Right    Extremity/Trunk Assessment   Upper Extremity Assessment Upper Extremity Assessment: Generalized weakness    Lower Extremity Assessment Lower Extremity Assessment: Generalized weakness    Cervical / Trunk Assessment Cervical / Trunk Assessment: Normal  Communication   Communication: HOH  Cognition Arousal/Alertness: Awake/alert;Lethargic Behavior During Therapy: WFL for tasks assessed/performed Overall Cognitive Status: Within Functional Limits for tasks assessed                                        General Comments      Exercises     Assessment/Plan    PT Assessment Patient needs continued PT services  PT Problem List Decreased strength;Decreased mobility;Decreased safety awareness;Decreased range of motion;Decreased coordination;Decreased activity tolerance;Decreased cognition;Cardiopulmonary status limiting activity;Decreased balance;Decreased knowledge of use of DME       PT Treatment Interventions DME instruction;Therapeutic exercise;Gait training;Balance training;Functional mobility training;Therapeutic activities;Patient/family education    PT Goals (Current goals can be found  in the Care Plan section)  Acute Rehab PT Goals Patient Stated Goal: go home PT Goal Formulation: With patient Time For Goal Achievement: 01/16/20 Potential to Achieve Goals: Good    Frequency Min 3X/week   Barriers to discharge        Co-evaluation               AM-PAC PT "6 Clicks" Mobility  Outcome Measure Help needed turning from your back to your side while in a flat bed without using bedrails?: A Little Help needed moving from lying on your back to sitting on the side of a flat bed without using bedrails?: A Little Help needed moving to and from a bed to a chair (including a wheelchair)?: A Lot Help needed standing up from a chair using your arms (e.g., wheelchair or bedside chair)?: A Little Help needed to walk in hospital room?: A Lot Help needed climbing 3-5 steps with a railing? : Total 6 Click Score: 14    End of Session Equipment Utilized During Treatment: Gait belt;Oxygen Activity Tolerance: Patient limited by fatigue;Patient tolerated treatment well Patient left: in chair;with call bell/phone within reach;with chair alarm set;with family/visitor present Nurse Communication: Mobility status PT Visit Diagnosis: Unsteadiness on feet (R26.81);Other abnormalities of gait and mobility (R26.89);Repeated falls (R29.6);Muscle weakness (generalized) (  M62.81);History of falling (Z91.81);Difficulty in walking, not elsewhere classified (R26.2)    Time: 0240-9735 PT Time Calculation (min) (ACUTE ONLY): 31 min   Charges:   PT Evaluation $PT Eval Moderate Complexity: 1 Mod PT Treatments $Therapeutic Activity: 23-37 mins        3:45 PM , 01/02/20 Lorin Picket, SPT Physical Therapy with   [facility] [phone] office  During this treatment session, the therapist was present, participating in and directing the treatment.  3:45 PM, 01/02/20 Ocie Bob, MPT Physical Therapist with [facility] [phone] office [phone]  mobile phone

## 2020-01-02 NOTE — Progress Notes (Signed)
   01/02/20 2138  Vitals  Temp 98.2 F (36.8 C)  Temp Source Axillary  BP (!) 92/56  MAP (mmHg) 68  BP Location Right Arm  BP Method Automatic  Patient Position (if appropriate) Lying  Pulse Rate 67  Pulse Rate Source Dinamap  Level of Consciousness  Level of Consciousness Alert  MEWS COLOR  MEWS Score Color Green  Oxygen Therapy  SpO2 100 %  O2 Device Nasal Cannula  O2 Flow Rate (L/min) 1.5 L/min  PCA/Epidural/Spinal Assessment  Respiratory Pattern Regular;Unlabored  Glasgow Coma Scale  Eye Opening 4  Best Verbal Response (NON-intubated) 4  Best Motor Response 5  [place] Coma Scale Score 13  MEWS Score  MEWS Temp 0  MEWS Systolic 1  MEWS Pulse 0  MEWS RR 0  MEWS LOC 0  MEWS Score 1   Thew patient is transferred from ICU to 312. A & O to self. No s/s of pain or any acute distress noted. The patient is oriented to his room,ascom/call bell and staff. Will continue to monitor.

## 2020-01-02 NOTE — NC FL2 (Signed)
[place] MEDICAID FL2 LEVEL OF CARE SCREENING TOOL     IDENTIFICATION  Patient Name: Gary Sellers Birthdate: 02-25-1935 Sex: male Admission Date (Current Location): 12/28/2019  [facility] and [place] Number:  [employer] and Address:  [facility],  [address] [place] [address]      Provider Number: [id number]  Attending Physician Name and Address:  Erick Blinks, DO  Relative Name and Phone Number:  Antawan Mchugh    Current Level of Care: Hospital Recommended Level of Care: Skilled Nursing Facility Prior Approval Number:    Date Approved/Denied: 01/02/20 PASRR Number: [id number]  Discharge Plan: SNF    Current Diagnoses: Patient Active Problem List   Diagnosis Date Noted  . PE (pulmonary thromboembolism) (HCC) 12/28/2019    Orientation RESPIRATION BLADDER Height & Weight     Self  Normal Incontinent, External catheter Weight: 143 lb 1.3 oz (64.9 kg) Height:  5\' 11"  (180.3 cm)  BEHAVIORAL SYMPTOMS/MOOD NEUROLOGICAL BOWEL NUTRITION STATUS      Continent Diet  AMBULATORY STATUS COMMUNICATION OF NEEDS Skin   Extensive Assist   Normal                       Personal Care Assistance Level of Assistance  Bathing, Feeding, Dressing Bathing Assistance: Maximum assistance Feeding assistance: Limited assistance Dressing Assistance: Limited assistance     Functional Limitations Info  Hearing, Sight, Speech Sight Info: Adequate Hearing Info: Impaired Speech Info: Adequate    SPECIAL CARE FACTORS FREQUENCY  PT (By licensed PT)     PT Frequency: 5x week              Contractures Contractures Info: Not present    Additional Factors Info  Code Status, Allergies Code Status Info: Full Allergies Info: n/a           Current Medications (01/02/2020):  This is the current hospital active medication list Current Facility-Administered Medications  Medication Dose Route Frequency Provider Last Rate Last Admin  . 0.9 %  sodium  chloride infusion  250 mL Intravenous PRN 01/04/2020, Pratik D, DO      . acetaminophen (TYLENOL) tablet 650 mg  650 mg Oral Q6H PRN Sherryll Burger, Pratik D, DO       Or  . acetaminophen (TYLENOL) suppository 650 mg  650 mg Rectal Q6H PRN Sherryll Burger, Pratik D, DO      . apixaban (ELIQUIS) tablet 10 mg  10 mg Oral BID Sherryll Burger, Pratik D, DO   10 mg at 01/02/20 1116   Followed by  . [START ON 01/09/2020] apixaban (ELIQUIS) tablet 5 mg  5 mg Oral BID 01/11/2020, Pratik D, DO      . carbidopa-levodopa (SINEMET IR) 25-100 MG per tablet immediate release 1 tablet  1 tablet Oral Q3H Shah, Pratik D, DO   1 tablet at 01/02/20 1116  . Chlorhexidine Gluconate Cloth 2 % PADS 6 each  6 each Topical Daily 01/04/20, Pratik D, DO   6 each at 01/02/20 0930  . haloperidol lactate (HALDOL) injection 2 mg  2 mg Intravenous Q6H PRN 01/04/20, Pratik D, DO   2 mg at 01/02/20 0242  . ipratropium-albuterol (DUONEB) 0.5-2.5 (3) MG/3ML nebulizer solution 3 mL  3 mL Nebulization Q6H PRN 01/04/20, Pratik D, DO   3 mL at 12/29/19 1952  . ondansetron (ZOFRAN) tablet 4 mg  4 mg Oral Q6H PRN 02/28/20, Pratik D, DO       Or  . ondansetron (ZOFRAN) injection 4  mg  4 mg Intravenous Q6H PRN Sherryll Burger, Pratik D, DO      . polyethylene glycol (MIRALAX / GLYCOLAX) packet 17 g  17 g Oral Daily PRN Sherryll Burger, Pratik D, DO   17 g at 01/01/20 1719  . sodium chloride flush (NS) 0.9 % injection 3 mL  3 mL Intravenous Q12H Shah, Pratik D, DO   3 mL at 01/02/20 0930  . sodium chloride flush (NS) 0.9 % injection 3 mL  3 mL Intravenous PRN Sherryll Burger, Pratik D, DO      . thyroid (ARMOUR) tablet 30 mg  30 mg Oral Daily Sherryll Burger, Pratik D, DO   30 mg at 01/02/20 0935     Discharge Medications: Please see discharge summary for a list of discharge medications.  Relevant Imaging Results:  Relevant Lab Results:   Additional Information    Barry Brunner, LCSW

## 2020-01-02 NOTE — Plan of Care (Addendum)
  Problem: Acute Rehab PT Goals(only PT should resolve) Goal: Pt Will Go Supine/Side To Sit Outcome: Progressing Flowsheets (Taken 01/02/2020 1137) Pt will go Supine/Side to Sit: with min guard assist Goal: Patient Will Transfer Sit To/From Stand Outcome: Progressing Flowsheets (Taken 01/02/2020 1137) Patient will transfer sit to/from stand: with minimal assist Goal: Pt Will Transfer Bed To Chair/Chair To Bed Outcome: Progressing Flowsheets (Taken 01/02/2020 1137) Pt will Transfer Bed to Chair/Chair to Bed: . with min assist . with mod assist Goal: Pt Will Ambulate Outcome: Progressing Flowsheets (Taken 01/02/2020 1137) Pt will Ambulate: . 15 feet . with moderate assist . with rolling walker   11:38 AM , 01/02/20 Lorin Picket, SPT Physical Therapy with [place]  [facility] [phone] office   During this treatment session, the therapist was present, participating in and directing the treatment.  3:45 PM, 01/02/20 Ocie Bob, MPT Physical Therapist with [facility] [address] [phone] office [phone] mobile phone

## 2020-01-02 NOTE — TOC Progression Note (Signed)
Transition of Care [facility]) - Progression Note    Patient Details  Name: Luisenrique Conran MRN: [id number] Date of Birth: 02-Sep-1934  Transition of Care [facility]) CM/SW Contact  Barry Brunner, LCSW Phone Number: 01/02/2020, 4:51 PM  Clinical Narrative:    CSW received SNF consult for patient. CSW contacted patient's wife to discuss SNF placements. Patient's wife reported that patient is agreeable to be referred to [facility] but is not agreeable to SNF's out side of the [place]. CSW placed referral for [facility], completed FL2, and PASSR. TOC to follow     Barriers to Discharge: Continued Medical Work up  Expected Discharge Plan and Services                                                 Social Determinants of Health (SDOH) Interventions    Readmission Risk Interventions Readmission Risk Prevention Plan 01/02/2020  Transportation Screening Complete  PCP or Specialist Appt within 5-7 Days Complete  Home Care Screening Complete  Medication Review (RN CM) Complete  Some recent data might be hidden

## 2020-01-02 NOTE — Progress Notes (Signed)
PROGRESS NOTE    Gary Sellers  YJE:[id number] DOB: Aug 30, 1934 DOA: 12/28/2019 PCP: System, Provider Not In   Brief Narrative:  Per HPI: Gary Sellers a 85 y.o.malewith medical history significant forParkinson disease with deep brain stimulator, hypothyroidism, and GERD who presented via EMS after he was noted to have significant lethargy and weakness as well as progressive shortness of breath. His symptoms have progressed over the course of 2 weeks and appears to be of gradual onset. He reportedly went to [facility] emergency department for evaluation of his symptoms and everything came back normal with the exception of an elevated blood glucose level and so he was sent home. EMS was called after he slumped to the ground and was noted to have some stool incontinence. EMS reported room oxygen saturation of approximately 84% which increased to 95% on 4 L. He was also noted to have some initial soft blood pressure readings with systolics in the 90s. Patient has recent poor oral intake, but denies any abdominal pain, chest pain, fever, chills, cough, nausea, vomiting, or diarrhea.  9/10:Patient has been admitted with acute hypoxemic respiratory failure in the setting of submassive PE and has been started on heparin drip. 2D echocardiogram with LVEF 55-60% and signs of right ventricular overload noted.He has been noted to have significant agitation overnight requiring Haldol.  9/11:Patient seen and evaluated this morning with no agitation noted overnight. He currently remains on heparin drip with stable blood pressure readings noted.  9/12: Patient has done well overnight and is stable for transfer to telemetry today.  Continue heparin drip as ordered through today.  Plan for PT evaluation with potential need for SNF.  9/13: Patient overall doing well.  Plan to discontinue heparin drip today and transition to Eliquis.  OT recommending SNF.  Assessment & Plan:   Active Problems:   PE  (pulmonary thromboembolism) (HCC)   Acute hypoxemic respiratory failure secondary to submassive PE -Noted RV strain on imaging -Pulmonology to consult them continue following; no need for TPA or transfer noted -IV heparin discontinued after 5 days of treatment and will transition to Eliquis -Plan for heme/onc follow up outpatient to assess for hypercoagulability; will obtain panel while here  Elevated troponin likely secondary to RV strain above -No chest pain noted -EKG without any concerning findings -2D echocardiogram reviewed with LVEF 55-60% and no wall motion abnormalities. RV strain noted. -Continue now on Eliquis -Okay for transfer to telemetry  CKDIIIa -Currently below baseline creatinine around 1.1-1.2 -Continue to monitor  History of parkinsonism -Continue Sinemet  History of GERD -Continue PPI  History of hypothyroidism -Continue home medication -TSH 2.7   DVT prophylaxis:Eliquis Code Status:Full code Family Communication:Discussed with  caretaker at bedside 9/13 Disposition Plan: Status is: Inpatient  Remains inpatient appropriate because:IV treatments appropriate due to intensity of illness or inability to take PO and Inpatient level of care appropriate due to severity of illness   Dispo: The patient is from:Home Anticipated d/c is to:SNF Anticipated d/c date is: 1 day Patient currently is not medically stable to d/c.Patient has no transition from IV heparin to Eliquis.  Consultants:  PCCM  Procedures:  See below  2D echocardiogram 9/9 with LVEF 55-60% and RV overload  Antimicrobials:   None   Subjective: Patient seen and evaluated today with no new acute complaints or concerns. No acute concerns or events noted overnight.  Objective: Vitals:   01/02/20 0815 01/02/20 0900 01/02/20 1000 01/02/20 1106  BP:      Pulse:  74 70 74  Resp: (!) 24 18 20 17   Temp: 97.8 F (36.6 C)    97.6 F (36.4 C)  TempSrc: Axillary   Oral  SpO2:  96% 98% 95%  Weight:      Height:        Intake/Output Summary (Last 24 hours) at 01/02/2020 1335 Last data filed at 01/02/2020 0700 Gross per 24 hour  Intake 628.55 ml  Output 450 ml  Net 178.55 ml   Filed Weights   12/30/19 0500 12/31/19 0200 01/02/20 0400  Weight: 65.8 kg 67.9 kg 64.9 kg    Examination:  General exam: Appears calm and comfortable  Respiratory system: Clear to auscultation. Respiratory effort normal.  Currently on 1.5 L nasal cannula oxygen. Cardiovascular system: S1 & S2 heard, RRR.  Gastrointestinal system: Abdomen is nondistended, soft and nontender.  Central nervous system: Alert and oriented. No focal neurological deficits. Extremities: Symmetric 5 x 5 power. Skin: No rashes, lesions or ulcers Psychiatry: Judgement and insight appear normal. Mood & affect appropriate.     Data Reviewed: I have personally reviewed following labs and imaging studies  CBC: Recent Labs  Lab 12/29/19 0346 12/30/19 0526 12/31/19 0606 01/01/20 0653 01/02/20 0555  WBC 13.0* 8.1 8.2 10.4 9.9  HGB 12.0* 11.7* 12.1* 12.5* 12.8*  HCT 37.2* 37.2* 37.4* 39.9 40.4  MCV 94.4 95.9 94.9 94.8 95.3  PLT 185 187 209 256 252   Basic Metabolic Panel: Recent Labs  Lab 12/28/19 1040 12/30/19 0526 12/31/19 0606 01/01/20 0653 01/02/20 0555  NA 137 138 136 136 139  K 4.6 3.8 3.9 4.0 3.9  CL 104 108 106 102 103  CO2 22 21* 23 26 26   GLUCOSE 166* 102* 112* 118* 102*  BUN 29* 27* 24* 23 24*  CREATININE 1.19 1.21 0.99 0.94 0.98  CALCIUM 8.9 8.8* 8.6* 8.9 9.3  MG  --  2.1 2.0 2.1 2.2   GFR: Estimated Creatinine Clearance: 52.4 mL/min (by C-G formula based on SCr of 0.98 mg/dL). Liver Function Tests: Recent Labs  Lab 12/28/19 1040  AST 20  ALT <5  ALKPHOS 71  BILITOT 0.9  PROT 6.1*  ALBUMIN 3.5   No results for input(s): LIPASE, AMYLASE in the last 168 hours. No results for input(s): AMMONIA in the last 168  hours. Coagulation Profile: No results for input(s): INR, PROTIME in the last 168 hours. Cardiac Enzymes: No results for input(s): CKTOTAL, CKMB, CKMBINDEX, TROPONINI in the last 168 hours. BNP (last 3 results) No results for input(s): PROBNP in the last 8760 hours. HbA1C: No results for input(s): HGBA1C in the last 72 hours. CBG: No results for input(s): GLUCAP in the last 168 hours. Lipid Profile: No results for input(s): CHOL, HDL, LDLCALC, TRIG, CHOLHDL, LDLDIRECT in the last 72 hours. Thyroid Function Tests: No results for input(s): TSH, T4TOTAL, FREET4, T3FREE, THYROIDAB in the last 72 hours. Anemia Panel: No results for input(s): VITAMINB12, FOLATE, FERRITIN, TIBC, IRON, RETICCTPCT in the last 72 hours. Sepsis Labs: Recent Labs  Lab 12/28/19 1040 12/28/19 1456  LATICACIDVEN 2.1* 1.4    Recent Results (from the past 240 hour(s))  SARS Coronavirus 2 by RT PCR (hospital order, performed in [facility] hospital lab) Nasopharyngeal Nasopharyngeal Swab     Status: None   Collection Time: 12/28/19  8:02 PM   Specimen: Nasopharyngeal Swab  Result Value Ref Range Status   SARS Coronavirus 2 NEGATIVE NEGATIVE Final    Comment: (NOTE) SARS-CoV-2 target nucleic acids are NOT DETECTED.  The SARS-CoV-2 RNA is generally detectable  in upper and lower respiratory specimens during the acute phase of infection. The lowest concentration of SARS-CoV-2 viral copies this assay can detect is 250 copies / mL. A negative result does not preclude SARS-CoV-2 infection and should not be used as the sole basis for treatment or other patient management decisions.  A negative result may occur with improper specimen collection / handling, submission of specimen other than nasopharyngeal swab, presence of viral mutation(s) within the areas targeted by this assay, and inadequate number of viral copies (<250 copies / mL). A negative result must be combined with clinical observations, patient history,  and epidemiological information.  Fact Sheet for Patients:   BoilerBrush.com.cy  Fact Sheet for Healthcare Providers: https://pope.com/  This test is not yet approved or  cleared by the [place] FDA and has been authorized for detection and/or diagnosis of SARS-CoV-2 by FDA under an Emergency Use Authorization (EUA).  This EUA will remain in effect (meaning this test can be used) for the duration of the COVID-19 declaration under Section 564(b)(1) of the Act, 21 U.S.C. section 360bbb-3(b)(1), unless the authorization is terminated or revoked sooner.  Performed at [facility], [address] [place], [place] [address]   MRSA PCR Screening     Status: None   Collection Time: 12/29/19 10:21 PM   Specimen: Nasal Mucosa; Nasopharyngeal  Result Value Ref Range Status   MRSA by PCR NEGATIVE NEGATIVE Final    Comment:        The GeneXpert MRSA Assay (FDA approved for NASAL specimens only), is one component of a comprehensive MRSA colonization surveillance program. It is not intended to diagnose MRSA infection nor to guide or monitor treatment for MRSA infections. Performed at [facility], [address] [place], [place] [address]          Radiology Studies: No results found.      Scheduled Meds: . apixaban  10 mg Oral BID   Followed by  . [START ON 01/09/2020] apixaban  5 mg Oral BID  . carbidopa-levodopa  1 tablet Oral Q3H  . Chlorhexidine Gluconate Cloth  6 each Topical Daily  . sodium chloride flush  3 mL Intravenous Q12H  . thyroid  30 mg Oral Daily   Continuous Infusions: . sodium chloride       LOS: 5 days    Time spent: 30 minutes    Mykalah Saari Hoover Brunette, DO [facility]  If 7PM-7AM, please contact night-coverage [email or web address] 01/02/2020, 1:35 PM

## 2020-01-02 NOTE — Progress Notes (Signed)
ANTICOAGULATION CONSULT NOTE   Pharmacy Consult for Heparin Indication: pulmonary embolus  No Known Allergies  Patient Measurements: Height: 5\' 11"  (180.3 cm) Weight: 64.9 kg (143 lb 1.3 oz) IBW/kg (Calculated) : 75.3 Heparin Dosing Weight:  70kg  Vital Signs: Temp: 97.8 F (36.6 C) (09/13 0815) Temp Source: Axillary (09/13 0815) BP: 115/65 (09/13 0400) Pulse Rate: 66 (09/13 0700)  Labs: Recent Labs    12/31/19 0606 12/31/19 0606 01/01/20 0653 01/02/20 0555  HGB 12.1*   < > 12.5* 12.8*  HCT 37.4*  --  39.9 40.4  PLT 209  --  256 252  HEPARINUNFRC 0.53  --  0.47  --   CREATININE 0.99  --  0.94  --    < > = values in this interval not displayed.    Estimated Creatinine Clearance: 54.7 mL/min (by C-G formula based on SCr of 0.94 mg/dL).   Medical History: Past Medical History:  Diagnosis Date  . Dementia (HCC)   . Parkinson disease (HCC)     Assessment: Start IV heparin for new PE.01/04/20 Baseline Hgb 12.8 Plts 201.Start IV heparin for new PE. - 9/8 CT: Positive for acute bilateral pulmonary emboli with CT evidence of right heart strain (RV/LV Ratio = 1.8) consistent with at least submassive (intermediate risk) PE.   01-02-20 update Heparin level: 0.60 IU/mL-->within goal range  on heparin at 1500 units/hr CBC: Hb: 12.8 (stable)  Plates 09-16-1992 RN reports no bleeding complications or issues with infusion site  Goal of Therapy:  Heparin level 0.5-0.7 per MD Monitor platelets by anticoagulation protocol: Yes   Plan:  Continue heparin at 1500 units/hr Daily heparin level and CBC  [id number], Pharm. D. Clinical Pharmacist 01/02/2020 8:46 AM

## 2020-01-03 LAB — SARS CORONAVIRUS 2 BY RT PCR (HOSPITAL ORDER, PERFORMED IN ~~LOC~~ HOSPITAL LAB): SARS Coronavirus 2: NEGATIVE

## 2020-01-03 LAB — BASIC METABOLIC PANEL
Anion gap: 8 (ref 5–15)
BUN: 31 mg/dL — ABNORMAL HIGH (ref 8–23)
CO2: 26 mmol/L (ref 22–32)
Calcium: 9.3 mg/dL (ref 8.9–10.3)
Chloride: 104 mmol/L (ref 98–111)
Creatinine, Ser: 1.13 mg/dL (ref 0.61–1.24)
GFR calc Af Amer: >60 mL/min (ref >60)
GFR calc non Af Amer: 60 mL/min — ABNORMAL LOW (ref >60)
Glucose, Bld: 106 mg/dL — ABNORMAL HIGH (ref 70–99)
Potassium: 4.1 mmol/L (ref 3.5–5.1)
Sodium: 138 mmol/L (ref 135–145)

## 2020-01-03 LAB — CARDIOLIPIN ANTIBODIES, IGG, IGM, IGA
Anticardiolipin IgA: <9 APL U/mL (ref 0–11)
Anticardiolipin IgG: <9 GPL U/mL (ref 0–14)
Anticardiolipin IgM: 11 MPL U/mL (ref 0–12)

## 2020-01-03 LAB — CBC
HCT: 39.7 % (ref 39.0–52.0)
Hemoglobin: 12.8 g/dL — ABNORMAL LOW (ref 13.0–17.0)
MCH: 30.7 pg (ref 26.0–34.0)
MCHC: 32.2 g/dL (ref 30.0–36.0)
MCV: 95.2 fL (ref 80.0–100.0)
Platelets: 284 10*3/uL (ref 150–400)
RBC: 4.17 MIL/uL — ABNORMAL LOW (ref 4.22–5.81)
RDW: 13 % (ref 11.5–15.5)
WBC: 8 10*3/uL (ref 4.0–10.5)
nRBC: 0 % (ref 0.0–0.2)

## 2020-01-03 LAB — PROTEIN S ACTIVITY: Protein S Activity: 85 % (ref 63–140)

## 2020-01-03 LAB — LUPUS ANTICOAGULANT PANEL
DRVVT: 44 s (ref 0.0–47.0)
PTT Lupus Anticoagulant: 47.8 s (ref 0.0–51.9)

## 2020-01-03 LAB — MAGNESIUM: Magnesium: 2.2 mg/dL (ref 1.7–2.4)

## 2020-01-03 LAB — HEPARIN LEVEL (UNFRACTIONATED): Heparin Unfractionated: >2.2 IU/mL — ABNORMAL HIGH (ref 0.30–0.70)

## 2020-01-03 LAB — PROTEIN C ACTIVITY: Protein C Activity: 78 % (ref 73–180)

## 2020-01-03 LAB — PROTEIN S, TOTAL: Protein S Ag, Total: 109 % (ref 60–150)

## 2020-01-03 MED ORDER — APIXABAN 5 MG PO TABS: 10.0000 mg | ORAL_TABLET | Freq: Two times a day (BID) | ORAL | 0 refills | Status: DC

## 2020-01-03 MED ORDER — APIXABAN 5 MG PO TABS: 5.0000 mg | ORAL_TABLET | Freq: Two times a day (BID) | ORAL | 2 refills | Status: DC

## 2020-01-03 NOTE — Progress Notes (Signed)
EMS is still not here to pick the patient.  I talked to [facility] the supervisor,  who stated per their facility policy patient will not be accepted after 8 pm. She indicated patient can come in the morning.

## 2020-01-03 NOTE — Care Management Important Message (Signed)
Important Message  Patient Details  Name: Gary Sellers MRN: [id number] Date of Birth: 1934-11-03   Medicare Important Message Given:  Yes     Corey Harold 01/03/2020, 12:08 PM

## 2020-01-03 NOTE — Progress Notes (Signed)
Report called to [employer].  Patient awaiting EMS transport.  Family made aware.

## 2020-01-03 NOTE — Discharge Summary (Signed)
Physician Discharge Summary  Gary Sellers:[id number] DOB: 1934-11-28 DOA: 12/28/2019  PCP: System, Provider Not In  Admit date: 12/28/2019  Discharge date: 01/03/2020  Admitted From:Home  Disposition:  SNF  Recommendations for Outpatient Follow-up:  1. Follow up with PCP in 1-2 weeks 2. Please obtain BMP/CBC in one week 3. Please follow-up with hematology as requested in 2 weeks to review hypercoagulable panel and review of ongoing treatment for PE 4. Continue on Eliquis as prescribed for treatment of PE 5. Continue on other home medications as prior  Home Health: None  Equipment/Devices: None  Discharge Condition: Stable  CODE STATUS: Full  Diet recommendation: Heart Healthy  Brief/Interim Summary: Per HPI: Gary Sellers a 85 y.o.malewith medical history significant forParkinson disease with deep brain stimulator, hypothyroidism, and GERD who presented via EMS after he was noted to have significant lethargy and weakness as well as progressive shortness of breath. His symptoms have progressed over the course of 2 weeks and appears to be of gradual onset. He reportedly went to [facility] emergency department for evaluation of his symptoms and everything came back normal with the exception of an elevated blood glucose level and so he was sent home. EMS was called after he slumped to the ground and was noted to have some stool incontinence. EMS reported room oxygen saturation of approximately 84% which increased to 95% on 4 L. He was also noted to have some initial soft blood pressure readings with systolics in the 90s. Patient has recent poor oral intake, but denies any abdominal pain, chest pain, fever, chills, cough, nausea, vomiting, or diarrhea.  9/10-9/14:Patient has been admitted with acute hypoxemic respiratory failure in the setting of submassive PE and has been started on heparin drip. 2D echocardiogram with LVEF 55-60% and signs of right ventricular overload  noted.He has been noted to have intermittent agitation which has now subsided.  He has been transitioned to Eliquis over the course of 5 days and has had stable vital signs.  He has been seen by physical therapy with recommendation for SNF noted.  His acute hypoxemic respiratory failure has mostly resolved and he is in stable condition for discharge with no further acute events noted throughout the course of this admission.  He has had a hypercoagulable panel ordered with results pending.  He will need close follow-up with hematology to review.  Discharge Diagnoses:  Active Problems:   PE (pulmonary thromboembolism) (HCC)  Principal discharge diagnosis: Acute hypoxemic respiratory failure in the setting of submassive PE.  Discharge Instructions  Discharge Instructions    Diet - low sodium heart healthy   Complete by: As directed    Increase activity slowly   Complete by: As directed      Allergies as of 01/03/2020   No Known Allergies     Medication List    STOP taking these medications   testosterone cypionate 200 MG/ML injection Commonly known as: DEPOTESTOSTERONE CYPIONATE   traMADol 50 MG tablet Commonly known as: ULTRAM     TAKE these medications   apixaban 5 MG Tabs tablet Commonly known as: ELIQUIS Take 2 tablets (10 mg total) by mouth 2 (two) times daily for 7 days.   apixaban 5 MG Tabs tablet Commonly known as: ELIQUIS Take 1 tablet (5 mg total) by mouth 2 (two) times daily. Start taking on: January 09, 2020   Armour Thyroid 30 MG tablet Generic drug: thyroid Take 1 tablet by mouth daily.   ascorbic acid 500 MG tablet Commonly known as: VITAMIN C  Take 1 tablet by mouth 2 (two) times daily.   carbidopa-levodopa 25-100 MG tablet Commonly known as: SINEMET IR Take 1 tablet by mouth every 3 (three) hours.   Coenzyme Q10 10 MG capsule Take 1 tablet by mouth daily.   DHEA 25 MG Caps Take 1 tablet by mouth daily.   dorzolamide-timolol 22.3-6.8 MG/ML  ophthalmic solution Commonly known as: COSOPT Place 1 drop into both eyes 2 (two) times daily.   Garlic 100 MG Tabs Take 1 tablet by mouth daily.   GLUTATHIONE PO Take 1 tablet by mouth daily.   KRILL OIL PO Take 1 tablet by mouth daily.   multivitamin with minerals tablet Take 1 tablet by mouth daily.   omeprazole 40 MG capsule Commonly known as: PRILOSEC   polyethylene glycol 17 g packet Commonly known as: MIRALAX / GLYCOLAX Take 17 g by mouth daily.   Travatan Z 0.004 % Soln ophthalmic solution Generic drug: Travoprost (BAK Free) Place 1 drop into both eyes at bedtime.       Follow-up Information    Doreatha Massed, MD Follow up in 2 week(s).   Specialty: Hematology Contact information: [address] [place] [address] [phone]              No Known Allergies  Consultations:  PCCM   Procedures/Studies: CT ANGIO CHEST PE W OR WO CONTRAST  Result Date: 12/28/2019 CLINICAL DATA:  Low oxygen saturation EXAM: CT ANGIOGRAPHY CHEST WITH CONTRAST TECHNIQUE: Multidetector CT imaging of the chest was performed using the standard protocol during bolus administration of intravenous contrast. Multiplanar CT image reconstructions and MIPs were obtained to evaluate the vascular anatomy. CONTRAST:  OMNIPAQUE IOHEXOL 350 MG/ML SOLN COMPARISON:  Chest x-ray 12/28/2019, CT 01/07/2018 FINDINGS: Cardiovascular: Satisfactory opacification of the pulmonary arteries to the segmental level. Acute thrombus within the truncus anterior artery, right upper lobe segmental and subsegmental vessels, right middle lobar, segmental and subsegmental vessels, as well as right inter lobar and right lower lobe segmental and subsegmental vessels. Positive for acute embolus within descending left pulmonary artery, left upper and lower lobe segmental and subsegmental pulmonary vessels. Positive for right heart strain with RV LV ratio of 1.8. Reflux of contrast into the intra hepatic  IVC. Cardiomegaly. No pericardial effusion. Nonaneurysmal aorta. Mild aortic atherosclerosis. Mild coronary vascular calcification. Mediastinum/Nodes: Midline trachea. No thyroid mass. No suspicious adenopathy. Mild air in fluid distension of upper esophagus. Suspected distal esophageal thickening. Lungs/Pleura: No pneumothorax or pleural effusion. Mild ground-glass density in the posterior left upper lobe and the subpleural lingula. Upper Abdomen: Nodular adrenal glands.  No acute abnormality. Musculoskeletal: No chest wall abnormality. No acute or significant osseous findings. Review of the MIP images confirms the above findings. IMPRESSION: 1. Positive for acute bilateral pulmonary emboli with CT evidence of right heart strain (RV/LV Ratio = 1.8) consistent with at least submassive (intermediate risk) PE. The presence of right heart strain has been associated with an increased risk of morbidity and mortality. 2. Cardiomegaly. 3. Mild ground-glass density in the posterior left upper lobe and subpleural lingula, could reflect small foci of pneumonia, to include atypical or viral etiology. Pulmonary infarctions are also considered in the differential. 4. Aortic atherosclerosis. Critical Value/emergent results were called by telephone at the time of interpretation on 12/28/2019 at 9:47 pm to provider [facility] , who verbally acknowledged these results. Aortic Atherosclerosis (ICD10-I70.0). Electronically Signed   By: Jasmine Pang M.D.   On: 12/28/2019 21:47   DG Chest [facility] [address]  Result Date: 12/28/2019 CLINICAL DATA:  Shortness of breath. EXAM: PORTABLE CHEST 1 VIEW COMPARISON:  February 03, 2013. FINDINGS: The heart size and mediastinal contours are within normal limits. Both lungs are clear. No pneumothorax or pleural effusion is noted. The visualized skeletal structures are unremarkable. IMPRESSION: No active disease. Electronically Signed   By: Lupita Raider M.D.   On: 12/28/2019 10:46   DG Chest Port  1V same Day  Result Date: 12/28/2019 CLINICAL DATA:  Shortness of breath. EXAM: PORTABLE CHEST 1 VIEW COMPARISON:  Earlier film, same date. FINDINGS: The cardiac silhouette, mediastinal and hilar contours are within normal limits and stable. Streaky basilar scarring changes. No definite infiltrates or effusions. No worrisome pulmonary lesions. The bony thorax is intact. IMPRESSION: Streaky basilar scarring changes but no definite infiltrates or effusions. Electronically Signed   By: Rudie Meyer M.D.   On: 12/28/2019 20:26   ECHOCARDIOGRAM COMPLETE  Result Date: 12/29/2019    ECHOCARDIOGRAM REPORT   Patient Name:   CALIBER LANDESS Date of Exam: 12/29/2019 Medical Rec #:  [id number]      Height:       70.0 in Accession #:    [id number]     Weight:       154.3 lb Date of Birth:  19-Feb-1935       BSA:          1.870 m Patient Age:    85 years       BP:           111/75 mmHg Patient Gender: M              HR:           78 bpm. [facility] Procedure: 2D Echo, Cardiac Doppler and Color Doppler Indications:    Pulmonary Embolus 415.19 / I26.99  History:        Patient has no prior history of Echocardiogram examinations.                 (From Hx) Dementia & Parkinson disease.  Sonographer:    Celesta Gentile RCS Referring Phys: [id number] Lamont Dowdy [facility]  Sonographer Comments: Patient unawear of his surroundings. "Fought" with patient entire exam. Best I could obtain. IMPRESSIONS  1. Left ventricular ejection fraction, by estimation, is 55 to 60%. The left ventricle has normal function. The left ventricle has no regional wall motion abnormalities. Left ventricular diastolic parameters are indeterminate. There is the interventricular septum is flattened in diastole ('D' shaped left ventricle), consistent with right ventricular volume overload.  2. McConnell's sign consistent with diagnosis of pulmonary embolus. Right ventricular systolic function is severely reduced. The right ventricular size is moderately enlarged.  There is moderately elevated pulmonary artery systolic pressure. The estimated right ventricular systolic pressure is 56.7 mmHg.  3. Right atrial size was upper normal.  4. The mitral valve is grossly normal. Trivial mitral valve regurgitation.  5. Tricuspid valve regurgitation is mild to moderate.  6. The aortic valve is tricuspid. Aortic valve regurgitation is not visualized.  7. The inferior vena cava is dilated in size with <50% respiratory variability, suggesting right atrial pressure of 15 mmHg. FINDINGS  Left Ventricle: Left ventricular ejection fraction, by estimation, is 55 to 60%. The left ventricle has normal function. The left ventricle has no regional wall motion abnormalities. The left ventricular internal cavity size was normal in size. There is  borderline left ventricular hypertrophy. The interventricular septum is flattened in diastole ('D' shaped left  ventricle), consistent with right ventricular volume overload. Left ventricular diastolic parameters are indeterminate. Right Ventricle: McConnell's sign consistent with diagnosis of pulmonary embolus. The right ventricular size is moderately enlarged. No increase in right ventricular wall thickness. Right ventricular systolic function is severely reduced. There is moderately elevated pulmonary artery systolic pressure. The tricuspid regurgitant velocity is 3.23 m/s, and with an assumed right atrial pressure of 15 mmHg, the estimated right ventricular systolic pressure is 56.7 mmHg. Left Atrium: Left atrial size was normal in size. Right Atrium: Right atrial size was upper normal. Pericardium: There is no evidence of pericardial effusion. Mitral Valve: The mitral valve is grossly normal. Trivial mitral valve regurgitation. Tricuspid Valve: The tricuspid valve is grossly normal. Tricuspid valve regurgitation is mild to moderate. Aortic Valve: The aortic valve is tricuspid. There is mild aortic valve annular calcification. Aortic valve regurgitation is  not visualized. Pulmonic Valve: The pulmonic valve was not well visualized. Pulmonic valve regurgitation is trivial. Aorta: The aortic root is normal in size and structure. Venous: The inferior vena cava is dilated in size with less than 50% respiratory variability, suggesting right atrial pressure of 15 mmHg. IAS/Shunts: No atrial level shunt detected by color flow Doppler.  LEFT VENTRICLE PLAX 2D LVIDd:         3.82 cm  Diastology LVIDs:         2.72 cm  LV e' medial:    5.00 cm/s LV PW:         1.12 cm  LV E/e' medial:  6.8 LV IVS:        0.88 cm  LV e' lateral:   9.46 cm/s LVOT diam:     1.80 cm  LV E/e' lateral: 3.6 LV SV:         44 LV SV Index:   24 LVOT Area:     2.54 cm  RIGHT VENTRICLE RV S prime:     10.60 cm/s TAPSE (M-mode): 1.8 cm LEFT ATRIUM             Index       RIGHT ATRIUM           Index LA diam:        3.00 cm 1.60 cm/m  RA Area:     20.10 cm LA Vol (A2C):   48.6 ml 26.00 ml/m RA Volume:   60.60 ml  32.41 ml/m LA Vol (A4C):   53.0 ml 28.35 ml/m LA Biplane Vol: 53.2 ml 28.46 ml/m  AORTIC VALVE LVOT Vmax:   84.40 cm/s LVOT Vmean:  63.500 cm/s LVOT VTI:    0.174 m  AORTA Ao Root diam: 3.40 cm MITRAL VALVE               TRICUSPID VALVE MV Area (PHT): 2.46 cm    TR Peak grad:   41.7 mmHg MV Decel Time: 308 msec    TR Vmax:        323.00 cm/s MV E velocity: 33.80 cm/s MV A velocity: 54.70 cm/s  SHUNTS MV E/A ratio:  0.62        Systemic VTI:  0.17 m                            Systemic Diam: 1.80 cm Nona DellSamuel Mcdowell MD Electronically signed by Nona DellSamuel Mcdowell MD Signature Date/Time: 12/29/2019/5:03:52 PM    Final     Discharge Exam: Vitals:   01/03/20 0215 01/03/20 0600  BP:  115/60  Pulse: 63 70  Resp:  18  Temp:  98 F (36.7 C)  SpO2: 97% 96%   Vitals:   01/02/20 2140 01/03/20 0214 01/03/20 0215 01/03/20 0600  BP:  113/66  115/60  Pulse:  63 63 70  Resp:  16  18  Temp:  97.9 F (36.6 C)  98 F (36.7 C)  TempSrc:      SpO2:   97% 96%  Weight: 66 kg     Height:         General: Pt is alert, awake, not in acute distress Cardiovascular: RRR, S1/S2 +, no rubs, no gallops Respiratory: CTA bilaterally, no wheezing, no rhonchi, currently on 1L [place] Abdominal: Soft, NT, ND, bowel sounds + Extremities: no edema, no cyanosis    The results of significant diagnostics from this hospitalization (including imaging, microbiology, ancillary and laboratory) are listed below for reference.     Microbiology: Recent Results (from the past 240 hour(s))  SARS Coronavirus 2 by RT PCR (hospital order, performed in [facility] hospital lab) Nasopharyngeal Nasopharyngeal Swab     Status: None   Collection Time: 12/28/19  8:02 PM   Specimen: Nasopharyngeal Swab  Result Value Ref Range Status   SARS Coronavirus 2 NEGATIVE NEGATIVE Final    Comment: (NOTE) SARS-CoV-2 target nucleic acids are NOT DETECTED.  The SARS-CoV-2 RNA is generally detectable in upper and lower respiratory specimens during the acute phase of infection. The lowest concentration of SARS-CoV-2 viral copies this assay can detect is 250 copies / mL. A negative result does not preclude SARS-CoV-2 infection and should not be used as the sole basis for treatment or other patient management decisions.  A negative result may occur with improper specimen collection / handling, submission of specimen other than nasopharyngeal swab, presence of viral mutation(s) within the areas targeted by this assay, and inadequate number of viral copies (<250 copies / mL). A negative result must be combined with clinical observations, patient history, and epidemiological information.  Fact Sheet for Patients:   BoilerBrush.com.cy  Fact Sheet for Healthcare Providers: https://pope.com/  This test is not yet approved or  cleared by the [place] FDA and has been authorized for detection and/or diagnosis of SARS-CoV-2 by FDA under an Emergency Use Authorization (EUA).   This EUA will remain in effect (meaning this test can be used) for the duration of the COVID-19 declaration under Section 564(b)(1) of the Act, 21 U.S.C. section 360bbb-3(b)(1), unless the authorization is terminated or revoked sooner.  Performed at [facility], [address], [place], [place] [address]   MRSA PCR Screening     Status: None   Collection Time: 12/29/19 10:21 PM   Specimen: Nasal Mucosa; Nasopharyngeal  Result Value Ref Range Status   MRSA by PCR NEGATIVE NEGATIVE Final    Comment:        The GeneXpert MRSA Assay (FDA approved for NASAL specimens only), is one component of a comprehensive MRSA colonization surveillance program. It is not intended to diagnose MRSA infection nor to guide or monitor treatment for MRSA infections. Performed at [facility], [address], [place], [place] [address]      Labs: BNP (last 3 results) Recent Labs    12/28/19 2100  BNP 752.0*   Basic Metabolic Panel: Recent Labs  Lab 12/30/19 0526 12/31/19 0606 01/01/20 0653 01/02/20 0555 01/03/20 0604  NA 138 136 136 139 138  K 3.8 3.9 4.0 3.9 4.1  CL 108 106 102 103 104  CO2 21* 23  26 26 26   GLUCOSE 102* 112* 118* 102* 106*  BUN 27* 24* 23 24* 31*  CREATININE 1.21 0.99 0.94 0.98 1.13  CALCIUM 8.8* 8.6* 8.9 9.3 9.3  MG 2.1 2.0 2.1 2.2 2.2   Liver Function Tests: Recent Labs  Lab 12/28/19 1040  AST 20  ALT <5  ALKPHOS 71  BILITOT 0.9  PROT 6.1*  ALBUMIN 3.5   No results for input(s): LIPASE, AMYLASE in the last 168 hours. No results for input(s): AMMONIA in the last 168 hours. CBC: Recent Labs  Lab 12/30/19 0526 12/31/19 0606 01/01/20 0653 01/02/20 0555 01/03/20 0604  WBC 8.1 8.2 10.4 9.9 8.0  HGB 11.7* 12.1* 12.5* 12.8* 12.8*  HCT 37.2* 37.4* 39.9 40.4 39.7  MCV 95.9 94.9 94.8 95.3 95.2  PLT 187 209 256 252 284   Cardiac Enzymes: No results for input(s): CKTOTAL, CKMB, CKMBINDEX, TROPONINI in the last 168 hours. BNP: Invalid input(s):  POCBNP CBG: No results for input(s): GLUCAP in the last 168 hours. D-Dimer No results for input(s): DDIMER in the last 72 hours. Hgb A1c No results for input(s): HGBA1C in the last 72 hours. Lipid Profile No results for input(s): CHOL, HDL, LDLCALC, TRIG, CHOLHDL, LDLDIRECT in the last 72 hours. Thyroid function studies No results for input(s): TSH, T4TOTAL, T3FREE, THYROIDAB in the last 72 hours.  Invalid input(s): FREET3 Anemia work up No results for input(s): VITAMINB12, FOLATE, FERRITIN, TIBC, IRON, RETICCTPCT in the last 72 hours. Urinalysis    Component Value Date/Time   COLORURINE YELLOW 12/28/2019 1717   APPEARANCEUR CLEAR 12/28/2019 1717   LABSPEC 1.009 12/28/2019 1717   PHURINE 6.0 12/28/2019 1717   GLUCOSEU NEGATIVE 12/28/2019 1717   HGBUR NEGATIVE 12/28/2019 1717   BILIRUBINUR NEGATIVE 12/28/2019 1717   KETONESUR NEGATIVE 12/28/2019 1717   PROTEINUR NEGATIVE 12/28/2019 1717   NITRITE NEGATIVE 12/28/2019 1717   LEUKOCYTESUR NEGATIVE 12/28/2019 1717   Sepsis Labs Invalid input(s): PROCALCITONIN,  WBC,  LACTICIDVEN Microbiology Recent Results (from the past 240 hour(s))  SARS Coronavirus 2 by RT PCR (hospital order, performed in [facility] hospital lab) Nasopharyngeal Nasopharyngeal Swab     Status: None   Collection Time: 12/28/19  8:02 PM   Specimen: Nasopharyngeal Swab  Result Value Ref Range Status   SARS Coronavirus 2 NEGATIVE NEGATIVE Final    Comment: (NOTE) SARS-CoV-2 target nucleic acids are NOT DETECTED.  The SARS-CoV-2 RNA is generally detectable in upper and lower respiratory specimens during the acute phase of infection. The lowest concentration of SARS-CoV-2 viral copies this assay can detect is 250 copies / mL. A negative result does not preclude SARS-CoV-2 infection and should not be used as the sole basis for treatment or other patient management decisions.  A negative result may occur with improper specimen collection / handling, submission  of specimen other than nasopharyngeal swab, presence of viral mutation(s) within the areas targeted by this assay, and inadequate number of viral copies (<250 copies / mL). A negative result must be combined with clinical observations, patient history, and epidemiological information.  Fact Sheet for Patients:   BoilerBrush.com.cy  Fact Sheet for Healthcare Providers: https://pope.com/  This test is not yet approved or  cleared by the [place] FDA and has been authorized for detection and/or diagnosis of SARS-CoV-2 by FDA under an Emergency Use Authorization (EUA).  This EUA will remain in effect (meaning this test can be used) for the duration of the COVID-19 declaration under Section 564(b)(1) of the Act, 21 U.S.C. section 360bbb-3(b)(1), unless the  authorization is terminated or revoked sooner.  Performed at [facility], [address] [place], [place] [address]   MRSA PCR Screening     Status: None   Collection Time: 12/29/19 10:21 PM   Specimen: Nasal Mucosa; Nasopharyngeal  Result Value Ref Range Status   MRSA by PCR NEGATIVE NEGATIVE Final    Comment:        The GeneXpert MRSA Assay (FDA approved for NASAL specimens only), is one component of a comprehensive MRSA colonization surveillance program. It is not intended to diagnose MRSA infection nor to guide or monitor treatment for MRSA infections. Performed at [facility], [address] [place], [place] [address]      Time coordinating discharge: 35 minutes  SIGNED:   Erick Blinks, DO [facility] 01/03/2020, 10:40 AM  If 7PM-7AM, please contact night-coverage [email or web address]

## 2020-01-03 NOTE — TOC Transition Note (Signed)
Transition of Care [facility]) - CM/SW Discharge Note   Patient Details  Name: Gary Sellers MRN: [id number] Date of Birth: 07/14/1934  Transition of Care [facility]) CM/SW Contact:  Barry Brunner, LCSW Phone Number: 01/03/2020, 5:08 PM   Clinical Narrative:    CSW contacted patient's spouse for bed offers. Patient's spouse requested an additional referral be sent to [facility]. CSW placed referral with Roman Deboraha Sprang agreeable to take patient on 01/03/2020. CSW faxed patient order and discharge summary to [facility]. CSW called report and completed med necessity form.Nurse to call report and provide Negative Covid results in d/c paperwork. TOC signing off.   Final next level of care: Skilled Nursing Facility Barriers to Discharge: Barriers Resolved   Patient Goals and CMS Choice Patient states their goals for this hospitalization and ongoing recovery are:: Rehab with SNF   Choice offered to / list presented to : Patient, Spouse  Discharge Placement              Patient [facility] bed at: [facility] Patient to be transferred to facility by: [facility] Name of family member notified: Ammie Ferrier Patient and family notified of of transfer: 01/03/20  Discharge Plan and Services                                     Social Determinants of Health (SDOH) Interventions     Readmission Risk Interventions Readmission Risk Prevention Plan 01/03/2020 01/02/2020  Transportation Screening Complete Complete  PCP or Specialist Appt within 5-7 Days Complete Complete  Home Care Screening Complete Complete  Medication Review (RN CM) Complete Complete  Some recent data might be hidden

## 2020-01-04 LAB — CBC
HCT: 40.6 % (ref 39.0–52.0)
Hemoglobin: 12.9 g/dL — ABNORMAL LOW (ref 13.0–17.0)
MCH: 29.9 pg (ref 26.0–34.0)
MCHC: 31.8 g/dL (ref 30.0–36.0)
MCV: 94.2 fL (ref 80.0–100.0)
Platelets: 315 10*3/uL (ref 150–400)
RBC: 4.31 MIL/uL (ref 4.22–5.81)
RDW: 12.7 % (ref 11.5–15.5)
WBC: 12.2 10*3/uL — ABNORMAL HIGH (ref 4.0–10.5)
nRBC: 0 % (ref 0.0–0.2)

## 2020-01-04 LAB — BASIC METABOLIC PANEL
Anion gap: 8 (ref 5–15)
BUN: 34 mg/dL — ABNORMAL HIGH (ref 8–23)
CO2: 26 mmol/L (ref 22–32)
Calcium: 9.3 mg/dL (ref 8.9–10.3)
Chloride: 102 mmol/L (ref 98–111)
Creatinine, Ser: 1.11 mg/dL (ref 0.61–1.24)
GFR calc Af Amer: >60 mL/min (ref >60)
GFR calc non Af Amer: >60 mL/min (ref >60)
Glucose, Bld: 112 mg/dL — ABNORMAL HIGH (ref 70–99)
Potassium: 4.1 mmol/L (ref 3.5–5.1)
Sodium: 136 mmol/L (ref 135–145)

## 2020-01-04 LAB — PROTEIN C, TOTAL: Protein C, Total: 80 % (ref 60–150)

## 2020-01-04 LAB — MAGNESIUM: Magnesium: 2.2 mg/dL (ref 1.7–2.4)

## 2020-01-04 NOTE — Progress Notes (Signed)
01/04/2020 10:27 AM  Pt remains stable to discharge to SNF.  Awaiting for transportation arrangements to be made for him.  DC today.    Standley Dakins MD How to contact the [facility] Attending or Consulting provider 7A - 7P or covering provider during after hours 7P -7A, for this patient?  1. Check the care team in [facility] and look for a) attending/consulting TRH provider listed and b) the [facility] team listed 2. Log into [email or web address] and use [facility]'s universal password to access. If you do not have the password, please contact the hospital operator. 3. Locate the [facility] provider you are looking for under [facility] and page to a number that you can be directly reached. 4. If you still have difficulty reaching the provider, please page the [facility] (Director on Call) for the Hospitalists listed on amion for assistance.

## 2020-01-04 NOTE — TOC Transition Note (Signed)
Transition of Care [facility]) - CM/SW Discharge Note   Patient Details  Name: Arrin Pintor MRN: [id number] Date of Birth: 11-Jul-1934  [facility] CM/SW Contact:  Elliot Gault, LCSW Phone Number: 01/04/2020, 11:05 AM   Clinical Narrative:     Pt did not dc yesterday due to transportation delays. Pt remains medically stable for dc per MD. [facility] aware and admissions coordinator states they can take pt today. EMS arranged. Updated pt's wife by phone.  There are no other TOC needs for dc.  Final next level of care: Skilled Nursing Facility Barriers to Discharge: Barriers Resolved   Patient Goals and CMS Choice Patient states their goals for this hospitalization and ongoing recovery are:: Rehab with SNF   Choice offered to / list presented to : Patient, Spouse  Discharge Placement              Patient [facility] bed at: [facility] Patient to be transferred to facility by: [facility] Name of family member notified: Ammie Ferrier Patient and family notified of of transfer: 01/03/20  Discharge Plan and Services                                     Social Determinants of Health (SDOH) Interventions     Readmission Risk Interventions Readmission Risk Prevention Plan 01/03/2020 01/02/2020  Transportation Screening Complete Complete  PCP or Specialist Appt within 5-7 Days Complete Complete  Home Care Screening Complete Complete  Medication Review (RN CM) Complete Complete  Some recent data might be hidden

## 2020-01-05 LAB — PROTHROMBIN GENE MUTATION

## 2020-01-05 LAB — FACTOR 5 LEIDEN

## 2020-01-12 ENCOUNTER — Other Ambulatory Visit: Payer: Self-pay

## 2020-01-12 ENCOUNTER — Inpatient Hospital Stay (HOSPITAL_COMMUNITY): Payer: Medicare Other | Attending: Hematology | Admitting: Hematology

## 2020-01-12 DIAGNOSIS — Z79899 Other long term (current) drug therapy: Secondary | ICD-10-CM | POA: Diagnosis not present

## 2020-01-12 DIAGNOSIS — Z7901 Long term (current) use of anticoagulants: Secondary | ICD-10-CM | POA: Insufficient documentation

## 2020-01-12 DIAGNOSIS — Z86718 Personal history of other venous thrombosis and embolism: Secondary | ICD-10-CM | POA: Insufficient documentation

## 2020-01-12 DIAGNOSIS — G20 Parkinson's disease: Secondary | ICD-10-CM | POA: Insufficient documentation

## 2020-01-12 DIAGNOSIS — I2699 Other pulmonary embolism without acute cor pulmonale: Secondary | ICD-10-CM | POA: Diagnosis present

## 2020-01-12 NOTE — Progress Notes (Signed)
CONSULT NOTE  Patient Care Team: System, Provider Not In as PCP - General  CHIEF COMPLAINTS/PURPOSE OF CONSULTATION: Pulmonary embolism  HISTORY OF PRESENTING ILLNESS:  Gary Sellers 85 y.o. male was sent here for inpatient follow-up for recent pulmonary embolism.  He was discharged to a SNF for rehabilitation.  We are seeing him to determine duration of anticoagulation.  Patient reports he has had a DVT in his right leg in the past.  He is unsure about how many years ago.  He was on blood thinners for 6 months and then they were discontinued.  He reports no provoking circumstances.  He denies a history of alcohol smoking or illicit drug use.  He does report he is on testosterone injections every 2 weeks.  He denies any bleeding issues since starting blood thinners.  He denies any bleeding such as epistasis, hematuria or hematochezia.  Patient reports concern about needing his deep brain stimulator for his Parkinson's disease, battery replaced over the next couple months.  This does not involve a minor procedure at [facility].  He has a history of minor skin cancers removed.  He has a family history of a sister with rectal cancer.  Also had a brother with lung cancer.   MEDICAL HISTORY:  Past Medical History:  Diagnosis Date  . Dementia (HCC)   . Parkinson disease (HCC)     SURGICAL HISTORY: Past Surgical History:  Procedure Laterality Date  . DEEP BRAIN STIMULATOR PLACEMENT    . PACEMAKER PLACEMENT      SOCIAL HISTORY: Social History   Socioeconomic History  . Marital status: Married    Spouse name: Not on file  . Number of children: Not on file  . Years of education: Not on file  . Highest education level: Not on file  Occupational History  . Not on file  Tobacco Use  . Smoking status: Never Smoker  . Smokeless tobacco: Never Used  Vaping Use  . Vaping Use: Never used  Substance and Sexual Activity  . Alcohol use: Never  . Drug use: Never  . Sexual activity:  Never  Other Topics Concern  . Not on file  Social History Narrative  . Not on file   Social Determinants of Health   Financial Resource Strain:   . Difficulty of Paying Living Expenses: Not on file  Food Insecurity:   . Worried About Programme researcher, broadcasting/film/video in the Last Year: Not on file  . Ran Out of Food in the Last Year: Not on file  Transportation Needs:   . Lack of Transportation (Medical): Not on file  . Lack of Transportation (Non-Medical): Not on file  Physical Activity:   . Days of Exercise per Week: Not on file  . Minutes of Exercise per Session: Not on file  Stress:   . Feeling of Stress : Not on file  Social Connections:   . Frequency of Communication with Friends and Family: Not on file  . Frequency of Social Gatherings with Friends and Family: Not on file  . Attends Religious Services: Not on file  . Active Member of Clubs or Organizations: Not on file  . Attends Banker Meetings: Not on file  . Marital Status: Not on file  Intimate Partner Violence:   . Fear of Current or Ex-Partner: Not on file  . Emotionally Abused: Not on file  . Physically Abused: Not on file  . Sexually Abused: Not on file    FAMILY HISTORY: No family  history on file.  ALLERGIES:  has No Known Allergies.  MEDICATIONS:  Current Outpatient Medications  Medication Sig Dispense Refill  . apixaban (ELIQUIS) 5 MG TABS tablet Take 1 tablet (5 mg total) by mouth 2 (two) times daily. 120 tablet 2  . ascorbic acid (VITAMIN C) 500 MG tablet Take 1 tablet by mouth 2 (two) times daily.    . carbidopa-levodopa (SINEMET IR) 25-100 MG tablet Take 1 tablet by mouth every 3 (three) hours.    Marland Kitchen DHEA 25 MG CAPS Take 1 tablet by mouth daily.    . dorzolamide-timolol (COSOPT) 22.3-6.8 MG/ML ophthalmic solution Place 1 drop into both eyes 2 (two) times daily.     Marland Kitchen KRILL OIL PO Take 1 tablet by mouth daily.    . Multiple Vitamins-Minerals (MULTIVITAMIN WITH MINERALS) tablet Take 1 tablet by  mouth daily.    Marland Kitchen omeprazole (PRILOSEC) 40 MG capsule     . polyethylene glycol (MIRALAX / GLYCOLAX) packet Take 17 g by mouth daily. 14 each 0  . thyroid (ARMOUR THYROID) 30 MG tablet Take 1 tablet by mouth daily.     . TRAVATAN Z 0.004 % SOLN ophthalmic solution Place 1 drop into both eyes at bedtime.     Marland Kitchen apixaban (ELIQUIS) 5 MG TABS tablet Take 2 tablets (10 mg total) by mouth 2 (two) times daily for 7 days. 28 tablet 0  . Coenzyme Q10 10 MG capsule Take 1 tablet by mouth daily. (Patient not taking: Reported on 01/12/2020)    . Garlic 100 MG TABS Take 1 tablet by mouth daily. (Patient not taking: Reported on 01/12/2020)    . GLUTATHIONE PO Take 1 tablet by mouth daily. (Patient not taking: Reported on 01/12/2020)     No current facility-administered medications for this visit.    REVIEW OF SYSTEMS:   Constitutional: Denies fevers, chills or abnormal night sweats Respiratory: Denies cough, dyspnea or wheezes Cardiovascular: Denies palpitation, chest discomfort or lower extremity swelling Gastrointestinal:  Denies nausea, heartburn or change in bowel habits Skin: Denies abnormal skin rashes Lymphatics: Denies new lymphadenopathy or easy bruising Neurological:Denies numbness, tingling or new weaknesses Behavioral/Psych: Mood is stable, no new changes  All other systems were reviewed with the patient and are negative.  PHYSICAL EXAMINATION: ECOG PERFORMANCE STATUS: 1 - Symptomatic but completely ambulatory  Vital signs: BP: 110/65 Pulse: 70 Respirations: 20 Temperature: 96.9 O2 saturation: 95% on room air   GENERAL:alert, no distress and comfortable SKIN: skin color, texture, turgor are normal, no rashes or significant lesions NECK: supple, thyroid normal size, non-tender, without nodularity LYMPH:  no palpable lymphadenopathy in the cervical, axillary or inguinal LUNGS: clear to auscultation and percussion with normal breathing effort HEART: regular rate & rhythm and no murmurs  and no lower extremity edema ABDOMEN:abdomen soft, non-tender and normal bowel sounds Musculoskeletal:no cyanosis of digits and no clubbing  PSYCH: alert & oriented x 3 with fluent speech NEURO: no focal motor/sensory deficits  LABORATORY DATA:  I have reviewed the data as listed Recent Results (from the past 2160 hour(s))  Lactic acid, plasma     Status: Abnormal   Collection Time: 12/28/19 10:40 AM  Result Value Ref Range   Lactic Acid, Venous 2.1 (HH) 0.5 - 1.9 mmol/L    Comment: CRITICAL RESULT CALLED TO, READ BACK BY AND VERIFIED WITH: MYRICK,B AT 11:40AM ON 12/28/19 BY [facility] Performed at [facility], [address], [place], [place] [address]   CBC     Status: Abnormal   Collection Time:  12/28/19 10:40 AM  Result Value Ref Range   WBC 13.2 (H) 4.0 - 10.5 K/uL   RBC 4.31 4.22 - 5.81 MIL/uL   Hemoglobin 12.8 (L) 13.0 - 17.0 g/dL   HCT 30.841.0 39 - 52 %   MCV 95.1 80.0 - 100.0 fL   MCH 29.7 26.0 - 34.0 pg   MCHC 31.2 30.0 - 36.0 g/dL   RDW 65.713.2 84.611.5 - 96.215.5 %   Platelets 201 150 - 400 K/uL   nRBC 0.0 0.0 - 0.2 %    Comment: Performed at [facility], [address] [place], [place] [address]  Comprehensive metabolic panel     Status: Abnormal   Collection Time: 12/28/19 10:40 AM  Result Value Ref Range   Sodium 137 135 - 145 mmol/L   Potassium 4.6 3.5 - 5.1 mmol/L   Chloride 104 98 - 111 mmol/L   CO2 22 22 - 32 mmol/L   Glucose, Bld 166 (H) 70 - 99 mg/dL    Comment: Glucose reference range applies only to samples taken after fasting for at least 8 hours.   BUN 29 (H) 8 - 23 mg/dL   Creatinine, Ser 1.321.19 0.61 - 1.24 mg/dL   Calcium 8.9 8.9 - 44.010.3 mg/dL   Total Protein 6.1 (L) 6.5 - 8.1 g/dL   Albumin 3.5 3.5 - 5.0 g/dL   AST 20 15 - 41 U/L   ALT <5 0 - 44 U/L   Alkaline Phosphatase 71 38 - 126 U/L   Total Bilirubin 0.9 0.3 - 1.2 mg/dL   GFR calc non Af Amer 56 (L) >60 mL/min   GFR calc Af Amer >60 >60 mL/min   Anion gap 11 5 - 15    Comment: Performed at [facility], [address] [place], [place] [address]  Lactic acid, plasma     Status: None   Collection Time: 12/28/19  2:56 PM  Result Value Ref Range   Lactic Acid, Venous 1.4 0.5 - 1.9 mmol/L    Comment: Performed at [facility], [address] [place], [place] [address]  Urinalysis, Routine w reflex microscopic Urine, Catheterized     Status: None   Collection Time: 12/28/19  5:17 PM  Result Value Ref Range   Color, Urine YELLOW YELLOW   APPearance CLEAR CLEAR   Specific Gravity, Urine 1.009 1.005 - 1.030   pH 6.0 5.0 - 8.0   Glucose, UA NEGATIVE NEGATIVE mg/dL   Hgb urine dipstick NEGATIVE NEGATIVE   Bilirubin Urine NEGATIVE NEGATIVE   Ketones, ur NEGATIVE NEGATIVE mg/dL   Protein, ur NEGATIVE NEGATIVE mg/dL   Nitrite NEGATIVE NEGATIVE   Leukocytes,Ua NEGATIVE NEGATIVE    Comment: Performed at [facility], [address] [place], [place] [address]  SARS Coronavirus 2 by RT PCR (hospital order, performed in [facility] hospital lab) Nasopharyngeal Nasopharyngeal Swab     Status: None   Collection Time: 12/28/19  8:02 PM   Specimen: Nasopharyngeal Swab  Result Value Ref Range   SARS Coronavirus 2 NEGATIVE NEGATIVE    Comment: (NOTE) SARS-CoV-2 target nucleic acids are NOT DETECTED.  The SARS-CoV-2 RNA is generally detectable in upper and lower respiratory specimens during the acute phase of infection. The lowest concentration of SARS-CoV-2 viral copies this assay can detect is 250 copies / mL. A negative result does not preclude SARS-CoV-2 infection and should not be used as the sole basis for treatment or other patient management decisions.  A negative result may occur with improper specimen  collection / handling, submission of specimen other than nasopharyngeal swab, presence of viral mutation(s) within the areas targeted by this assay, and inadequate number of viral copies (<250 copies / mL). A negative result must be combined with clinical observations, patient history,  and epidemiological information.  Fact Sheet for Patients:   BoilerBrush.com.cy  Fact Sheet for Healthcare Providers: https://pope.com/  This test is not yet approved or  cleared by the [place] FDA and has been authorized for detection and/or diagnosis of SARS-CoV-2 by FDA under an Emergency Use Authorization (EUA).  This EUA will remain in effect (meaning this test can be used) for the duration of the COVID-19 declaration under Section 564(b)(1) of the Act, 21 U.S.C. section 360bbb-3(b)(1), unless the authorization is terminated or revoked sooner.  Performed at [facility], [address] [place], [place] [address]   Troponin I (High Sensitivity)     Status: Abnormal   Collection Time: 12/28/19  9:00 PM  Result Value Ref Range   Troponin I (High Sensitivity) 297 (HH) <18 ng/L    Comment: CRITICAL RESULT CALLED TO, READ BACK BY AND VERIFIED WITH: NICHOLS,K ON 12/28/19 AT 2205 BY LOY,C (NOTE) Elevated high sensitivity troponin I (hsTnI) values and significant  changes across serial measurements may suggest ACS but many other  chronic and acute conditions are known to elevate hsTnI results.  Refer to the Links section for chest pain algorithms and additional  guidance. Performed at [facility], [address] [place], [place] [address]   Brain natriuretic peptide     Status: Abnormal   Collection Time: 12/28/19  9:00 PM  Result Value Ref Range   B Natriuretic Peptide 752.0 (H) 0.0 - 100.0 pg/mL    Comment: Performed at [facility], [address], [place], [place] [address]  Troponin I (High Sensitivity)     Status: Abnormal   Collection Time: 12/28/19 10:59 PM  Result Value Ref Range   Troponin I (High Sensitivity) 307 (HH) <18 ng/L    Comment: CRITICAL VALUE NOTED.  VALUE IS CONSISTENT WITH PREVIOUSLY REPORTED AND CALLED VALUE. (NOTE) Elevated high sensitivity troponin I (hsTnI) values and significant  changes across  serial measurements may suggest ACS but many other  chronic and acute conditions are known to elevate hsTnI results.  Refer to the Links section for chest pain algorithms and additional  guidance. Performed at [facility], [address], [place], [place] [address]   CBC     Status: Abnormal   Collection Time: 12/29/19  3:46 AM  Result Value Ref Range   WBC 13.0 (H) 4.0 - 10.5 K/uL   RBC 3.94 (L) 4.22 - 5.81 MIL/uL   Hemoglobin 12.0 (L) 13.0 - 17.0 g/dL   HCT 29.5 (L) 39 - 52 %   MCV 94.4 80.0 - 100.0 fL   MCH 30.5 26.0 - 34.0 pg   MCHC 32.3 30.0 - 36.0 g/dL   RDW 62.1 30.8 - 65.7 %   Platelets 185 150 - 400 K/uL   nRBC 0.0 0.0 - 0.2 %    Comment: Performed at [facility], [address], [place], [place] [address]  Heparin level (unfractionated)     Status: None   Collection Time: 12/29/19  3:46 AM  Result Value Ref Range   Heparin Unfractionated 0.45 0.30 - 0.70 IU/mL    Comment: (NOTE) If heparin results are below expected values, and patient dosage has  been confirmed, suggest follow up testing of antithrombin III levels. Performed at [facility],  [address], [place], [place] [address]   TSH     Status: None   Collection Time: 12/29/19  3:46 AM  Result Value Ref Range   TSH 2.796 0.350 - 4.500 uIU/mL    Comment: Performed by a 3rd Generation assay with a functional sensitivity of <=0.01 uIU/mL. Performed at [facility], [address] [place], [place] [address]   Heparin level (unfractionated)     Status: Abnormal   Collection Time: 12/29/19 11:13 AM  Result Value Ref Range   Heparin Unfractionated <0.10 (L) 0.30 - 0.70 IU/mL    Comment: (NOTE) If heparin results are below expected values, and patient dosage has  been confirmed, suggest follow up testing of antithrombin III levels. Performed at [facility], [address] [place], [place] [address]   ECHOCARDIOGRAM COMPLETE     Status: None   Collection Time: 12/29/19  4:32 PM  Result Value Ref Range   Weight  2,469.15 oz   Height 70 in   BP 106/72 mmHg   Area-P 1/2 2.46 cm2   S' Lateral 2.72 cm  Heparin level (unfractionated)     Status: None   Collection Time: 12/29/19  7:00 PM  Result Value Ref Range   Heparin Unfractionated 0.31 0.30 - 0.70 IU/mL    Comment: (NOTE) If heparin results are below expected values, and patient dosage has  been confirmed, suggest follow up testing of antithrombin III levels. Performed at [facility], [address] [place], [place] [address]   MRSA PCR Screening     Status: None   Collection Time: 12/29/19 10:21 PM   Specimen: Nasal Mucosa; Nasopharyngeal  Result Value Ref Range   MRSA by PCR NEGATIVE NEGATIVE    Comment:        The GeneXpert MRSA Assay (FDA approved for NASAL specimens only), is one component of a comprehensive MRSA colonization surveillance program. It is not intended to diagnose MRSA infection nor to guide or monitor treatment for MRSA infections. Performed at [facility], [address] [place], [place] [address]   CBC     Status: Abnormal   Collection Time: 12/30/19  5:26 AM  Result Value Ref Range   WBC 8.1 4.0 - 10.5 K/uL   RBC 3.88 (L) 4.22 - 5.81 MIL/uL   Hemoglobin 11.7 (L) 13.0 - 17.0 g/dL   HCT 13.0 (L) 39 - 52 %   MCV 95.9 80.0 - 100.0 fL   MCH 30.2 26.0 - 34.0 pg   MCHC 31.5 30.0 - 36.0 g/dL   RDW 86.5 78.4 - 69.6 %   Platelets 187 150 - 400 K/uL   nRBC 0.0 0.0 - 0.2 %    Comment: Performed at [facility], [address], [place], [place] [address]  Heparin level (unfractionated)     Status: None   Collection Time: 12/30/19  5:26 AM  Result Value Ref Range   Heparin Unfractionated 0.57 0.30 - 0.70 IU/mL    Comment: (NOTE) If heparin results are below expected values, and patient dosage has  been confirmed, suggest follow up testing of antithrombin III levels. Performed at [facility], [address], [place], [place] [address]   Magnesium     Status: None   Collection Time: 12/30/19  5:26 AM  Result  Value Ref Range   Magnesium 2.1 1.7 - 2.4 mg/dL    Comment: Performed at [facility], [address], [place], [place] [address]  Basic metabolic panel     Status: Abnormal   Collection  Time: 12/30/19  5:26 AM  Result Value Ref Range   Sodium 138 135 - 145 mmol/L   Potassium 3.8 3.5 - 5.1 mmol/L    Comment: DELTA CHECK NOTED   Chloride 108 98 - 111 mmol/L   CO2 21 (L) 22 - 32 mmol/L   Glucose, Bld 102 (H) 70 - 99 mg/dL    Comment: Glucose reference range applies only to samples taken after fasting for at least 8 hours.   BUN 27 (H) 8 - 23 mg/dL   Creatinine, Ser 1.61 0.61 - 1.24 mg/dL   Calcium 8.8 (L) 8.9 - 10.3 mg/dL   GFR calc non Af Amer 55 (L) >60 mL/min   GFR calc Af Amer >60 >60 mL/min   Anion gap 9 5 - 15    Comment: Performed at [facility], [address], [place], [place] [address]  CBC     Status: Abnormal   Collection Time: 12/31/19  6:06 AM  Result Value Ref Range   WBC 8.2 4.0 - 10.5 K/uL   RBC 3.94 (L) 4.22 - 5.81 MIL/uL   Hemoglobin 12.1 (L) 13.0 - 17.0 g/dL   HCT 54.0 (L) 39 - 52 %   MCV 94.9 80.0 - 100.0 fL   MCH 30.7 26.0 - 34.0 pg   MCHC 32.4 30.0 - 36.0 g/dL   RDW 98.1 19.1 - 47.8 %   Platelets 209 150 - 400 K/uL   nRBC 0.0 0.0 - 0.2 %    Comment: Performed at [facility], [address], [place], [place] [address]  Heparin level (unfractionated)     Status: None   Collection Time: 12/31/19  6:06 AM  Result Value Ref Range   Heparin Unfractionated 0.53 0.30 - 0.70 IU/mL    Comment: (NOTE) If heparin results are below expected values, and patient dosage has  been confirmed, suggest follow up testing of antithrombin III levels. Performed at [facility], [address] [place], [place] [address]   Basic metabolic panel     Status: Abnormal   Collection Time: 12/31/19  6:06 AM  Result Value Ref Range   Sodium 136 135 - 145 mmol/L   Potassium 3.9 3.5 - 5.1 mmol/L   Chloride 106 98 - 111 mmol/L   CO2 23 22 - 32 mmol/L   Glucose, Bld 112 (H) 70 - 99  mg/dL    Comment: Glucose reference range applies only to samples taken after fasting for at least 8 hours.   BUN 24 (H) 8 - 23 mg/dL   Creatinine, Ser 5.78 0.61 - 1.24 mg/dL   Calcium 8.6 (L) 8.9 - 10.3 mg/dL   GFR calc non Af Amer >60 >60 mL/min   GFR calc Af Amer >60 >60 mL/min   Anion gap 7 5 - 15    Comment: Performed at [facility], [address] [place], [place] [address]  Magnesium     Status: None   Collection Time: 12/31/19  6:06 AM  Result Value Ref Range   Magnesium 2.0 1.7 - 2.4 mg/dL    Comment: Performed at [facility], [address] [place], [place] [address]  CBC     Status: Abnormal   Collection Time: 01/01/20  6:53 AM  Result Value Ref Range   WBC 10.4 4.0 - 10.5 K/uL   RBC 4.21 (L) 4.22 - 5.81 MIL/uL   Hemoglobin 12.5 (L) 13.0 - 17.0 g/dL   HCT 13.2 39 - 52 %   MCV 94.8 80.0 - 100.0 fL  MCH 29.7 26.0 - 34.0 pg   MCHC 31.3 30.0 - 36.0 g/dL   RDW 16.1 09.6 - 04.5 %   Platelets 256 150 - 400 K/uL   nRBC 0.0 0.0 - 0.2 %    Comment: Performed at [facility], [address] [place], [place] [address]  Heparin level (unfractionated)     Status: None   Collection Time: 01/01/20  6:53 AM  Result Value Ref Range   Heparin Unfractionated 0.47 0.30 - 0.70 IU/mL    Comment: (NOTE) If heparin results are below expected values, and patient dosage has  been confirmed, suggest follow up testing of antithrombin III levels. Performed at [facility], [address], [place], [place] [address]   Basic metabolic panel     Status: Abnormal   Collection Time: 01/01/20  6:53 AM  Result Value Ref Range   Sodium 136 135 - 145 mmol/L   Potassium 4.0 3.5 - 5.1 mmol/L   Chloride 102 98 - 111 mmol/L   CO2 26 22 - 32 mmol/L   Glucose, Bld 118 (H) 70 - 99 mg/dL    Comment: Glucose reference range applies only to samples taken after fasting for at least 8 hours.   BUN 23 8 - 23 mg/dL   Creatinine, Ser 8.29 0.61 - 1.24 mg/dL   Calcium 8.9 8.9 - 56.2 mg/dL   GFR calc non Af  Amer >60 >60 mL/min   GFR calc Af Amer >60 >60 mL/min   Anion gap 8 5 - 15    Comment: Performed at [facility], [address], [place], [place] [address]  Magnesium     Status: None   Collection Time: 01/01/20  6:53 AM  Result Value Ref Range   Magnesium 2.1 1.7 - 2.4 mg/dL    Comment: Performed at [facility], [address], [place], [place] [address]  Antithrombin III     Status: Abnormal   Collection Time: 01/01/20 12:44 PM  Result Value Ref Range   AntiThromb III Func 74 (L) 75 - 120 %    Comment: Performed at [facility], [address], [place], [place] [address]  Protein C activity     Status: None   Collection Time: 01/01/20 12:44 PM  Result Value Ref Range   Protein C Activity 78 73 - 180 %    Comment: (NOTE) A deficiency of protein C (PC), either congenital or acquired, increases the risk of thromboembolism. Acquired PC deficiency occurs more frequently than congenital deficiency. PC levels can be transiently diminished after a thrombotic event or surgery or in the presence of certain anticoagulants. Heparin, direct Xa inhibitor, or thrombotic inhibitor therapy does not alter PC levels physiologically and does not interfere with this assay because it is chromogenic and clot-based. Vitamin K antagonist therapy may decrease plasma levels of functional protein C (PC) as PC is a vitamin K- dependent protein. Vitamin K deficiency, due to dietary insufficiency or malabsorption will also lead to reduced PC levels. Acquired deficiency can be found in individuals with disseminated intravascular coagulation (DIC) and sepsis. Severe hepatic disorders (hepatitis, cirrhosis, etc.), nephrotic syndrome, malignancy and inflammatory bowel disease can lead to diminished PC levels. Drug  therapy with L-asparaginse or fluorouracil can also reduce PC levels. Levels may be decreased in patients with polycythemia vera, sickle cell disease and essential thrombocythemia. Repeat  evaluation on a new plasma sample to confirm or refute this result should be considered, after ruling out acquired causes, depending on the clinical scenario.  Performed At: [facility] [address] [place], [place] [id number] Jolene Schimke MD WG:[id number]   Protein C, total     Status: None   Collection Time: 01/01/20 12:44 PM  Result Value Ref Range   Protein C, Total 80 60 - 150 %    Comment: (NOTE) Performed At: [facility] [address] [place], [place] [id number] Jolene Schimke MD [id number]   Protein S activity     Status: None   Collection Time: 01/01/20 12:44 PM  Result Value Ref Range   Protein S Activity 85 63 - 140 %    Comment: (NOTE) Protein S activity may be falsely increased (masking an abnormal, low result) in patients receiving direct Xa inhibitor (e.g., rivaroxaban, apixaban, edoxaban) or a direct thrombin inhibitor (e.g., dabigatran) anticoagulant treatment due to assay interference by these drugs. Performed At: [facility] [address] [place], [place] [id number] Jolene Schimke MD [id number]   Protein S, total     Status: None   Collection Time: 01/01/20 12:44 PM  Result Value Ref Range   Protein S Ag, Total 109 60 - 150 %    Comment: (NOTE) This test was developed and its performance characteristics determined by [facility]. It has not been cleared or approved by the Food and Drug Administration. Performed At: [facility] [address] [place], [place] [id number] Jolene Schimke MD [id number]   Lupus anticoagulant panel     Status: None   Collection Time: 01/01/20 12:44 PM  Result Value Ref Range   PTT Lupus Anticoagulant 47.8 0.0 - 51.9 sec    Comment: (NOTE) Additional testing confirms the presence of heparin in the test sample. Results obtained after heparin neutralization.    DRVVT 44.0 0.0 - 47.0 sec   Lupus Anticoag Interp Comment:     Comment: (NOTE) No lupus anticoagulant was  detected. Performed At: [facility] [address] [place], [place] [id number] Jolene Schimke MD DD:[id number]   Beta-2-glycoprotein i abs, IgG/M/A     Status: None   Collection Time: 01/01/20 12:44 PM  Result Value Ref Range   Beta-2 Glyco I IgG <9 0 - 20 GPI IgG units    Comment: (NOTE) The reference interval reflects a 3SD or 99th percentile interval, which is thought to represent a potentially clinically significant result in accordance with the International Consensus Statement on the classification criteria for definitive antiphospholipid syndrome (APS). J Thromb Haem 2006;4:295-306.    Beta-2-Glycoprotein I IgM <9 0 - 32 GPI IgM units    Comment: (NOTE) The reference interval reflects a 3SD or 99th percentile interval, which is thought to represent a potentially clinically significant result in accordance with the International Consensus Statement on the classification criteria for definitive antiphospholipid syndrome (APS). J Thromb Haem 2006;4:295-306. Performed At: [facility] [address] [place], [place] [id number] Jolene Schimke MD [id number]    Beta-2-Glycoprotein I IgA <9 0 - 25 GPI IgA units    Comment: (NOTE) The reference interval reflects a 3SD or 99th percentile interval, which is thought to represent a potentially clinically significant result in accordance with the International Consensus Statement on the classification criteria for definitive antiphospholipid syndrome (APS). J Thromb Haem 2006;4:295-306.   Factor 5 leiden     Status: None   Collection Time: 01/01/20 12:44 PM  Result Value Ref Range   Recommendations-F5LEID: Comment     Comment: (NOTE) Result:  Negative (no mutation found) Factor V Leiden is a specific mutation (R506Q) in the factor V gene that is  associated with an increased risk of venous thrombosis. Factor V Leiden is more resistant to inactivation by activated protein C.  As a result, factor V persists in  the circulation leading to a mild hyper- coagulable state.  The Leiden mutation accounts for 90% - 95% of APC resistance.  Factor V Leiden has been reported in patients with deep vein thrombosis, pulmonary embolus, central retinal vein occlusion, cerebral sinus thrombosis and hepatic vein thrombosis. Other risk factors to be considered in the workup for venous thrombosis include the G20210A mutation in the factor II (prothrombin) gene, protein S and C deficiency, and antithrombin deficiencies. Anticardiolipin antibody and lupus anticoagulant analysis may be appropriate for certain patients, as well as homocysteine levels. Contact your local [facility] for information on how to order additi onal testing if desired. **Genetic counselors are available for health care providers to**  discuss results at [phone] [phone]). Methodology: DNA analysis of the Factor V gene was performed by allele-specific PCR. The diagnostic sensitivity and specificity is >99% for both. Molecular-based testing is highly accurate, but as in any laboratory test, diagnostic errors may occur. All test results must be combined with clinical information for the most accurate interpretation. This test was developed and its performance characteristics determined by [facility]. It has not been cleared or approved by the Food and Drug Administration. References: Voelkerding K (1996).  Clin Lab Med [phone]. Vincenza Hews, PhD, [facility] Ernestene Mention, PhD, [facility] Gillian Shields, PhD, [facility] Manya Silvas, PhD, [facility] Bonnielee Haff, PhD, [facility] Alpha Gula PhD, [facility] Performed At: [facility] [address] [place], [place] [id number] Maurine Simmering MDPhD [id number]   Prothrombin gene mutation     Status: None   Collection Time: 01/01/20 12:44 PM  Result Value Ref Range   Recommendations-PTGENE: Comment     Comment: (NOTE) NEGATIVE No mutation identified. Comment: A point mutation (G20210A) in the  factor II (prothrombin) gene is the second most common cause of inherited thrombophilia. The incidence of this mutation in the [place] Caucasian population is about 2% and in the [place] American population it is approximately 0.5%. This mutation is rare in the [place] and Native American population. Being heterozygous for a prothrombin mutation increases the risk for developing venous thrombosis about 2 to 3 times above the general population risk. Being homozygous for the prothrombin gene mutation increases the relative risk for venous thrombosis further, although it is not yet known how much further the risk is increased. In women heterozygous for the prothrombin gene mutation, the use of estrogen containing oral contraceptives increases the relative risk of venous thrombosis about 16 times and the risk of developing cerebral thrombosis is also significantly increased. In pregnancy the pr othrombin gene mutation increases risk for venous thrombosis and may increase risk for stillbirth, placental abruption, pre-eclampsia and fetal growth restriction. If the patient possesses two or more congenital or acquired thrombophilic risk factors, the risk for thrombosis may rise to more than the sum of the risk ratios for the individual mutations. This assay detects only the prothrombin G20210A mutation and does not measure genetic abnormalities elsewhere in the genome. Other thrombotic risk factors may be pursued through systematic clinical laboratory analysis. These factors include the R506Q (Leiden) mutation in the Factor V gene, plasma homocysteine levels, as well as testing for deficiencies of antithrombin III, protein C and protein S. Genetic Counselors are available for health care providers to discuss results at [phone]). Methodology: DNA analysis of the Factor II gene was performed by  PCR amplification followed by restriction analysis. The di agnostic sensitivity is >99% for  both. All the tests must be combined with clinical information for the most accurate interpretation. Molecular-based testing is highly accurate, but as in any laboratory test, diagnostic errors may occur. This test was developed and its performance characteristics determined by [facility]. It has not been cleared or approved by the Food and Drug Administration. Poort SR, et al. Blood. 1996; 16:1096-0454. Varga EA. Circulation. 2004; 110:e15-e18. Marland Kitchen, et al. Arterioscler Thromb Vasc Biol. [phone]. Vincenza Hews, PhD, [facility] Ernestene Mention, PhD, [facility] Gillian Shields, PhD, [facility] Manya Silvas, PhD, [facility] Bonnielee Haff, PhD, [facility] Alpha Gula, PhD, [facility] Performed At: [facility] RTP [address] [place], [place] [id number] Maurine Simmering MDPhD [id number]   Cardiolipin antibodies, IgG, IgM, IgA     Status: None   Collection Time: 01/01/20 12:44 PM  Result Value Ref Range   Anticardiolipin IgG <9 0 - 14 GPL U/mL    Comment: (NOTE)                          Negative:              <15                          Indeterminate:     15 - 20                          Low-Med Positive: >20 - 80                          High Positive:         >80    Anticardiolipin IgM 11 0 - 12 MPL U/mL    Comment: (NOTE)                          Negative:              <13                          Indeterminate:     13 - 20                          Low-Med Positive: >20 - 80                          High Positive:         >80    Anticardiolipin IgA <9 0 - 11 APL U/mL    Comment: (NOTE)                          Negative:              <12                          Indeterminate:     12 - 20                          Low-Med Positive: >20 - 80  High Positive:         >80 Performed At: [facility] [address] [place], [place] [id number] Jolene Schimke MD [id number]   Homocysteine, serum     Status: None   Collection Time: 01/01/20 12:55 PM   Result Value Ref Range   Homocysteine 10.9 0.0 - 21.3 umol/L    Comment: (NOTE) Performed At: [facility] [address] [place], [place] [id number] Jolene Schimke MD [id number]   CBC     Status: Abnormal   Collection Time: 01/02/20  5:55 AM  Result Value Ref Range   WBC 9.9 4.0 - 10.5 K/uL   RBC 4.24 4.22 - 5.81 MIL/uL   Hemoglobin 12.8 (L) 13.0 - 17.0 g/dL   HCT 30.1 39 - 52 %   MCV 95.3 80.0 - 100.0 fL   MCH 30.2 26.0 - 34.0 pg   MCHC 31.7 30.0 - 36.0 g/dL   RDW 60.1 09.3 - 23.5 %   Platelets 252 150 - 400 K/uL   nRBC 0.0 0.0 - 0.2 %    Comment: Performed at [facility], [address] [place], [place] [address]  Heparin level (unfractionated)     Status: None   Collection Time: 01/02/20  5:55 AM  Result Value Ref Range   Heparin Unfractionated 0.60 0.30 - 0.70 IU/mL    Comment: (NOTE) If heparin results are below expected values, and patient dosage has  been confirmed, suggest follow up testing of antithrombin III levels. Performed at [facility], [address] [place], [place] [address]   Basic metabolic panel     Status: Abnormal   Collection Time: 01/02/20  5:55 AM  Result Value Ref Range   Sodium 139 135 - 145 mmol/L   Potassium 3.9 3.5 - 5.1 mmol/L   Chloride 103 98 - 111 mmol/L   CO2 26 22 - 32 mmol/L   Glucose, Bld 102 (H) 70 - 99 mg/dL    Comment: Glucose reference range applies only to samples taken after fasting for at least 8 hours.   BUN 24 (H) 8 - 23 mg/dL   Creatinine, Ser 7.06 0.61 - 1.24 mg/dL   Calcium 9.3 8.9 - 23.7 mg/dL   GFR calc non Af Amer >60 >60 mL/min   GFR calc Af Amer >60 >60 mL/min   Anion gap 10 5 - 15    Comment: Performed at [facility], [address], [place], [place] [address]  Magnesium     Status: None   Collection Time: 01/02/20  5:55 AM  Result Value Ref Range   Magnesium 2.2 1.7 - 2.4 mg/dL    Comment: Performed at [facility], [address], [place], [place] [address]  CBC     Status: Abnormal    Collection Time: 01/03/20  6:04 AM  Result Value Ref Range   WBC 8.0 4.0 - 10.5 K/uL   RBC 4.17 (L) 4.22 - 5.81 MIL/uL   Hemoglobin 12.8 (L) 13.0 - 17.0 g/dL   HCT 60.7 39 - 52 %   MCV 95.2 80.0 - 100.0 fL   MCH 30.7 26.0 - 34.0 pg   MCHC 32.2 30.0 - 36.0 g/dL   RDW 37.1 06.2 - 69.4 %   Platelets 284 150 - 400 K/uL   nRBC 0.0 0.0 - 0.2 %    Comment: Performed at [facility], [address], [place], [place] [address]  Heparin level (unfractionated)     Status: Abnormal   Collection Time: 01/03/20  6:04 AM  Result Value Ref Range   Heparin Unfractionated >2.20 (H) 0.30 - 0.70 IU/mL    Comment: RESULTS CONFIRMED BY MANUAL DILUTION Performed at [facility], [address] [place], [place] [address]   Basic metabolic panel     Status: Abnormal   Collection Time: 01/03/20  6:04 AM  Result Value Ref Range   Sodium 138 135 - 145 mmol/L   Potassium 4.1 3.5 - 5.1 mmol/L   Chloride 104 98 - 111 mmol/L   CO2 26 22 - 32 mmol/L   Glucose, Bld 106 (H) 70 - 99 mg/dL    Comment: Glucose reference range applies only to samples taken after fasting for at least 8 hours.   BUN 31 (H) 8 - 23 mg/dL   Creatinine, Ser 6.04 0.61 - 1.24 mg/dL   Calcium 9.3 8.9 - 54.0 mg/dL   GFR calc non Af Amer 60 (L) >60 mL/min   GFR calc Af Amer >60 >60 mL/min   Anion gap 8 5 - 15    Comment: Performed at [facility], [address], [place], [place] [address]  Magnesium     Status: None   Collection Time: 01/03/20  6:04 AM  Result Value Ref Range   Magnesium 2.2 1.7 - 2.4 mg/dL    Comment: Performed at [facility], [address] [place], [place] [address]  SARS Coronavirus 2 by RT PCR (hospital order, performed in [facility] hospital lab) Nasopharyngeal Nasopharyngeal Swab     Status: None   Collection Time: 01/03/20  3:18 PM   Specimen: Nasopharyngeal Swab  Result Value Ref Range   SARS Coronavirus 2 NEGATIVE NEGATIVE    Comment: (NOTE) SARS-CoV-2 target nucleic acids are NOT DETECTED.  The SARS-CoV-2  RNA is generally detectable in upper and lower respiratory specimens during the acute phase of infection. The lowest concentration of SARS-CoV-2 viral copies this assay can detect is 250 copies / mL. A negative result does not preclude SARS-CoV-2 infection and should not be used as the sole basis for treatment or other patient management decisions.  A negative result may occur with improper specimen collection / handling, submission of specimen other than nasopharyngeal swab, presence of viral mutation(s) within the areas targeted by this assay, and inadequate number of viral copies (<250 copies / mL). A negative result must be combined with clinical observations, patient history, and epidemiological information.  Fact Sheet for Patients:   BoilerBrush.com.cy  Fact Sheet for Healthcare Providers: https://pope.com/  This test is not yet approved or  cleared by the [place] FDA and has been authorized for detection and/or diagnosis of SARS-CoV-2 by FDA under an Emergency Use Authorization (EUA).  This EUA will remain in effect (meaning this test can be used) for the duration of the COVID-19 declaration under Section 564(b)(1) of the Act, 21 U.S.C. section 360bbb-3(b)(1), unless the authorization is terminated or revoked sooner.  Performed at [facility], [address], [place], [place] [address]   CBC     Status: Abnormal   Collection Time: 01/04/20  6:15 AM  Result Value Ref Range   WBC 12.2 (H) 4.0 - 10.5 K/uL   RBC 4.31 4.22 - 5.81 MIL/uL   Hemoglobin 12.9 (L) 13.0 - 17.0 g/dL   HCT 30.8 39 - 52 %   MCV 94.2 80.0 - 100.0 fL   MCH 29.9 26.0 - 34.0 pg   MCHC 31.8 30.0 - 36.0 g/dL   RDW 65.7 84.6 - 96.2 %   Platelets 315 150 - 400  K/uL   nRBC 0.0 0.0 - 0.2 %    Comment: Performed at [facility], [address] [place], [place] [address]  Basic metabolic panel     Status: Abnormal   Collection Time: 01/04/20  6:15 AM   Result Value Ref Range   Sodium 136 135 - 145 mmol/L   Potassium 4.1 3.5 - 5.1 mmol/L   Chloride 102 98 - 111 mmol/L   CO2 26 22 - 32 mmol/L   Glucose, Bld 112 (H) 70 - 99 mg/dL    Comment: Glucose reference range applies only to samples taken after fasting for at least 8 hours.   BUN 34 (H) 8 - 23 mg/dL   Creatinine, Ser 6.04 0.61 - 1.24 mg/dL   Calcium 9.3 8.9 - 54.0 mg/dL   GFR calc non Af Amer >60 >60 mL/min   GFR calc Af Amer >60 >60 mL/min   Anion gap 8 5 - 15    Comment: Performed at [facility], [address] [place], [place] [address]  Magnesium     Status: None   Collection Time: 01/04/20  6:15 AM  Result Value Ref Range   Magnesium 2.2 1.7 - 2.4 mg/dL    Comment: Performed at [facility], [address] [place], [place] [address]    RADIOGRAPHIC STUDIES: I have personally reviewed the radiological images as listed and agreed with the findings in the report. CT ANGIO CHEST PE W OR WO CONTRAST  Result Date: 12/28/2019 CLINICAL DATA:  Low oxygen saturation EXAM: CT ANGIOGRAPHY CHEST WITH CONTRAST TECHNIQUE: Multidetector CT imaging of the chest was performed using the standard protocol during bolus administration of intravenous contrast. Multiplanar CT image reconstructions and MIPs were obtained to evaluate the vascular anatomy. CONTRAST:  OMNIPAQUE IOHEXOL 350 MG/ML SOLN COMPARISON:  Chest x-ray 12/28/2019, CT 01/07/2018 FINDINGS: Cardiovascular: Satisfactory opacification of the pulmonary arteries to the segmental level. Acute thrombus within the truncus anterior artery, right upper lobe segmental and subsegmental vessels, right middle lobar, segmental and subsegmental vessels, as well as right inter lobar and right lower lobe segmental and subsegmental vessels. Positive for acute embolus within descending left pulmonary artery, left upper and lower lobe segmental and subsegmental pulmonary vessels. Positive for right heart strain with RV LV ratio of 1.8. Reflux of  contrast into the intra hepatic IVC. Cardiomegaly. No pericardial effusion. Nonaneurysmal aorta. Mild aortic atherosclerosis. Mild coronary vascular calcification. Mediastinum/Nodes: Midline trachea. No thyroid mass. No suspicious adenopathy. Mild air in fluid distension of upper esophagus. Suspected distal esophageal thickening. Lungs/Pleura: No pneumothorax or pleural effusion. Mild ground-glass density in the posterior left upper lobe and the subpleural lingula. Upper Abdomen: Nodular adrenal glands.  No acute abnormality. Musculoskeletal: No chest wall abnormality. No acute or significant osseous findings. Review of the MIP images confirms the above findings. IMPRESSION: 1. Positive for acute bilateral pulmonary emboli with CT evidence of right heart strain (RV/LV Ratio = 1.8) consistent with at least submassive (intermediate risk) PE. The presence of right heart strain has been associated with an increased risk of morbidity and mortality. 2. Cardiomegaly. 3. Mild ground-glass density in the posterior left upper lobe and subpleural lingula, could reflect small foci of pneumonia, to include atypical or viral etiology. Pulmonary infarctions are also considered in the differential. 4. Aortic atherosclerosis. Critical Value/emergent results were called by telephone at the time of interpretation on 12/28/2019 at 9:47 pm to provider [facility] , who verbally acknowledged these results. Aortic Atherosclerosis (ICD10-I70.0). Electronically Signed   By: Selena Batten  Jake Samples M.D.   On: 12/28/2019 21:47   DG Chest Port 1 View  Result Date: 12/28/2019 CLINICAL DATA:  Shortness of breath. EXAM: PORTABLE CHEST 1 VIEW COMPARISON:  February 03, 2013. FINDINGS: The heart size and mediastinal contours are within normal limits. Both lungs are clear. No pneumothorax or pleural effusion is noted. The visualized skeletal structures are unremarkable. IMPRESSION: No active disease. Electronically Signed   By: Lupita Raider M.D.   On:  12/28/2019 10:46   DG Chest Port 1V same Day  Result Date: 12/28/2019 CLINICAL DATA:  Shortness of breath. EXAM: PORTABLE CHEST 1 VIEW COMPARISON:  Earlier film, same date. FINDINGS: The cardiac silhouette, mediastinal and hilar contours are within normal limits and stable. Streaky basilar scarring changes. No definite infiltrates or effusions. No worrisome pulmonary lesions. The bony thorax is intact. IMPRESSION: Streaky basilar scarring changes but no definite infiltrates or effusions. Electronically Signed   By: Rudie Meyer M.D.   On: 12/28/2019 20:26   ECHOCARDIOGRAM COMPLETE  Result Date: 12/29/2019    ECHOCARDIOGRAM REPORT   Patient Name:   Gary Sellers Date of Exam: 12/29/2019 Medical Rec #:  [id number]      Height:       70.0 in Accession #:    [id number]     Weight:       154.3 lb Date of Birth:  13-Mar-1935       BSA:          1.870 m Patient Age:    85 years       BP:           111/75 mmHg Patient Gender: M              HR:           78 bpm. [facility] Procedure: 2D Echo, Cardiac Doppler and Color Doppler Indications:    Pulmonary Embolus 415.19 / I26.99  History:        Patient has no prior history of Echocardiogram examinations.                 (From Hx) Dementia & Parkinson disease.  Sonographer:    Celesta Gentile RCS Referring Phys: [id number] Lamont Dowdy [facility]  Sonographer Comments: Patient unawear of his surroundings. "Fought" with patient entire exam. Best I could obtain. IMPRESSIONS  1. Left ventricular ejection fraction, by estimation, is 55 to 60%. The left ventricle has normal function. The left ventricle has no regional wall motion abnormalities. Left ventricular diastolic parameters are indeterminate. There is the interventricular septum is flattened in diastole ('D' shaped left ventricle), consistent with right ventricular volume overload.  2. McConnell's sign consistent with diagnosis of pulmonary embolus. Right ventricular systolic function is severely reduced. The right  ventricular size is moderately enlarged. There is moderately elevated pulmonary artery systolic pressure. The estimated right ventricular systolic pressure is 56.7 mmHg.  3. Right atrial size was upper normal.  4. The mitral valve is grossly normal. Trivial mitral valve regurgitation.  5. Tricuspid valve regurgitation is mild to moderate.  6. The aortic valve is tricuspid. Aortic valve regurgitation is not visualized.  7. The inferior vena cava is dilated in size with <50% respiratory variability, suggesting right atrial pressure of 15 mmHg. FINDINGS  Left Ventricle: Left ventricular ejection fraction, by estimation, is 55 to 60%. The left ventricle has normal function. The left ventricle has no regional wall motion abnormalities. The left ventricular internal cavity size was normal in size. There is  borderline left ventricular hypertrophy. The interventricular septum is flattened in diastole ('D' shaped left ventricle), consistent with right ventricular volume overload. Left ventricular diastolic parameters are indeterminate. Right Ventricle: McConnell's sign consistent with diagnosis of pulmonary embolus. The right ventricular size is moderately enlarged. No increase in right ventricular wall thickness. Right ventricular systolic function is severely reduced. There is moderately elevated pulmonary artery systolic pressure. The tricuspid regurgitant velocity is 3.23 m/s, and with an assumed right atrial pressure of 15 mmHg, the estimated right ventricular systolic pressure is 56.7 mmHg. Left Atrium: Left atrial size was normal in size. Right Atrium: Right atrial size was upper normal. Pericardium: There is no evidence of pericardial effusion. Mitral Valve: The mitral valve is grossly normal. Trivial mitral valve regurgitation. Tricuspid Valve: The tricuspid valve is grossly normal. Tricuspid valve regurgitation is mild to moderate. Aortic Valve: The aortic valve is tricuspid. There is mild aortic valve annular  calcification. Aortic valve regurgitation is not visualized. Pulmonic Valve: The pulmonic valve was not well visualized. Pulmonic valve regurgitation is trivial. Aorta: The aortic root is normal in size and structure. Venous: The inferior vena cava is dilated in size with less than 50% respiratory variability, suggesting right atrial pressure of 15 mmHg. IAS/Shunts: No atrial level shunt detected by color flow Doppler.  LEFT VENTRICLE PLAX 2D LVIDd:         3.82 cm  Diastology LVIDs:         2.72 cm  LV e' medial:    5.00 cm/s LV PW:         1.12 cm  LV E/e' medial:  6.8 LV IVS:        0.88 cm  LV e' lateral:   9.46 cm/s LVOT diam:     1.80 cm  LV E/e' lateral: 3.6 LV SV:         44 LV SV Index:   24 LVOT Area:     2.54 cm  RIGHT VENTRICLE RV S prime:     10.60 cm/s TAPSE (M-mode): 1.8 cm LEFT ATRIUM             Index       RIGHT ATRIUM           Index LA diam:        3.00 cm 1.60 cm/m  RA Area:     20.10 cm LA Vol (A2C):   48.6 ml 26.00 ml/m RA Volume:   60.60 ml  32.41 ml/m LA Vol (A4C):   53.0 ml 28.35 ml/m LA Biplane Vol: 53.2 ml 28.46 ml/m  AORTIC VALVE LVOT Vmax:   84.40 cm/s LVOT Vmean:  63.500 cm/s LVOT VTI:    0.174 m  AORTA Ao Root diam: 3.40 cm MITRAL VALVE               TRICUSPID VALVE MV Area (PHT): 2.46 cm    TR Peak grad:   41.7 mmHg MV Decel Time: 308 msec    TR Vmax:        323.00 cm/s MV E velocity: 33.80 cm/s MV A velocity: 54.70 cm/s  SHUNTS MV E/A ratio:  0.62        Systemic VTI:  0.17 m                            Systemic Diam: 1.80 cm Nona Dell MD Electronically signed by Nona Dell MD Signature Date/Time: 12/29/2019/5:03:52 PM    Final  I have independently interviewed and examined this patient.  I agree with HPI written by my nurse practitioner Corliss Skains, FNP.  I have independently formulated my assessment and plan.  ASSESSMENT & PLAN:  1.  Unprovoked pulmonary embolism: -CT angio on 12/28/2019 shows acute bilateral pulmonary emboli with CT evidence of right  heart strain. -Patient was not severely immobile although his mobility is slightly limited by Parkinson's disease. -He is currently on Eliquis.  He is currently a resident at [facility] nursing home in [place]. -He had DVT/PE in the leg few years ago (October 2014) and was treated with 6 months of anticoagulation. -Reviewed labs from recent hospitalization from 01/01/2020.  Factor V Leiden, prothrombin gene mutation, lupus anticoagulant, beta-2 glycoprotein 1 antibody, anticardiolipin antibody were negative.  Protein C&S levels were normal.  Antithrombin III was normal. -As he had unprovoked to weakly provoked pulmonary embolism with right heart strain, I have recommended indefinite anticoagulation at this time.  He also had DVT/PE in 2014, precipitating circumstances unknown.  Patient is a poor historian. -I would recommend not stopping Eliquis for at least first 3 months.  I will reevaluate him in 4 months for follow-up.  2.  Parkinson's disease: -He has deep brain stimulator (DBS) placed at [facility] in 2009.  He has dysarthria and hypophonia. -He reportedly needs battery replacement in the next few months.   All questions were answered. The patient knows to call the clinic with any problems, questions or concerns.      Doreatha Massed, MD 01/12/20 6:19 PM

## 2020-01-20 ENCOUNTER — Telehealth (HOSPITAL_COMMUNITY): Payer: Self-pay

## 2020-01-20 ENCOUNTER — Other Ambulatory Visit: Payer: Self-pay

## 2020-01-20 ENCOUNTER — Emergency Department (HOSPITAL_COMMUNITY)
Admission: EM | Admit: 2020-01-20 | Discharge: 2020-01-20 | Discharge status: Against Medical Advice | Payer: Medicare Other

## 2020-01-20 ENCOUNTER — Ambulatory Visit
Admission: EM | Admit: 2020-01-20 | Discharge: 2020-01-20 | Disposition: A | Discharge status: Home / Self Care | Payer: Medicare Other

## 2020-01-20 NOTE — ED Triage Notes (Signed)
Pt sent by cancer center for ultrasound for leg, advised we were unable to provide imaging, will need to go to ed.

## 2020-01-20 NOTE — Telephone Encounter (Signed)
Patient's wife called stating that patient is having calf soreness and is wanting to know if he needs to be checked out.    Patient was advised to go to urgent care.

## 2020-01-21 ENCOUNTER — Encounter (HOSPITAL_COMMUNITY): Payer: Self-pay | Admitting: *Deleted

## 2020-01-21 ENCOUNTER — Other Ambulatory Visit: Payer: Self-pay

## 2020-01-21 ENCOUNTER — Emergency Department (HOSPITAL_COMMUNITY)
Admission: EM | Admit: 2020-01-21 | Discharge: 2020-01-21 | Disposition: A | Discharge status: Home / Self Care | Payer: Medicare Other | Attending: Emergency Medicine | Admitting: Emergency Medicine

## 2020-01-21 DIAGNOSIS — Z7901 Long term (current) use of anticoagulants: Secondary | ICD-10-CM | POA: Insufficient documentation

## 2020-01-21 DIAGNOSIS — F0390 Unspecified dementia without behavioral disturbance: Secondary | ICD-10-CM | POA: Insufficient documentation

## 2020-01-21 DIAGNOSIS — M79662 Pain in left lower leg: Secondary | ICD-10-CM | POA: Diagnosis present

## 2020-01-21 DIAGNOSIS — Z79899 Other long term (current) drug therapy: Secondary | ICD-10-CM | POA: Insufficient documentation

## 2020-01-21 DIAGNOSIS — Z950 Presence of cardiac pacemaker: Secondary | ICD-10-CM | POA: Diagnosis not present

## 2020-01-21 DIAGNOSIS — G20 Parkinson's disease: Secondary | ICD-10-CM | POA: Insufficient documentation

## 2020-01-21 NOTE — Discharge Instructions (Signed)
I have scheduled an outpatient ultrasound for Williams left leg for tomorrow.  Please return to the front desk of the [facility] emergency department tomorrow morning and tell them that you were seen the day prior and you are returning for just an outpatient ultrasound.  You should not need to check into the emergency department to have this done.

## 2020-01-21 NOTE — ED Triage Notes (Signed)
Pt with left lower leg for past 2 days c/o of being sore.  Pt with hx of DVT's and PCP wanted pt to have an US done to r/o DVT.  Pt is currently on blood thinners.

## 2020-01-21 NOTE — ED Notes (Signed)
I assumed care of this patient at this time. At the time of assuming care, bed is locked in the lowest position, side rails x2, call bell within reach and family at bedside. Pt appears in no acute distress, respirations are even and unlabored with equal chest rise and fall. All questions addressed by this RN, educated on hourly rounding and is in agreement at this time.

## 2020-01-21 NOTE — ED Provider Notes (Signed)
[facility] EMERGENCY DEPARTMENT Provider Note   CSN: [id number] Arrival date & time: 01/21/20  1255     History Chief Complaint  Patient presents with  . Leg Pain    Gary Sellers is a 85 y.o. male.  HPI   Patient is an 85 year old male with a medical history including dementia, Parkinson's disease, DVT, pulmonary embolism, who presents today with left calf pain for 2 days.  Patient's son is at bedside and provides most of his history.  He states that patient started complaining of tenderness in the left calf region.  They spoke to his hematologist who recommended that he come to the emergency department for ultrasound to rule out DVT.  Patient was most recently admitted for pulmonary embolism about 1 month ago and was started on Eliquis after this admission.  He takes 5 mg Eliquis twice daily which his son states he is compliant with.  His son states that his symptoms were much different at the time of his prior admission and notes he was experiencing shortness of breath and weakness which is not occurring at this moment.  Patient denies any acute shortness of breath or chest pain.  No abdominal pain, nausea, vomiting, diarrhea.     Past Medical History:  Diagnosis Date  . Dementia (HCC)   . Parkinson disease [facility])     Patient Active Problem List   Diagnosis Date Noted  . PE (pulmonary thromboembolism) (HCC) 12/28/2019    Past Surgical History:  Procedure Laterality Date  . DEEP BRAIN STIMULATOR PLACEMENT    . PACEMAKER PLACEMENT         History reviewed. No pertinent family history.  Social History   Tobacco Use  . Smoking status: Never Smoker  . Smokeless tobacco: Never Used  Vaping Use  . Vaping Use: Never used  Substance Use Topics  . Alcohol use: Never  . Drug use: Never    Home Medications Prior to Admission medications   Medication Sig Start Date End Date Taking? Authorizing Provider  apixaban (ELIQUIS) 5 MG TABS tablet Take 2 tablets (10 mg total)  by mouth 2 (two) times daily for 7 days. 01/03/20 01/10/20  Sherryll Burger, Pratik D, DO  apixaban (ELIQUIS) 5 MG TABS tablet Take 1 tablet (5 mg total) by mouth 2 (two) times daily. 01/09/20 03/09/20  Sherryll Burger, Pratik D, DO  ascorbic acid (VITAMIN C) 500 MG tablet Take 1 tablet by mouth 2 (two) times daily.    [provider]  carbidopa-levodopa (SINEMET IR) 25-100 MG tablet Take 1 tablet by mouth every 3 (three) hours. 12/09/19   [provider]  Coenzyme Q10 10 MG capsule Take 1 tablet by mouth daily. Patient not taking: Reported on 01/12/2020    [provider]  DHEA 25 MG CAPS Take 1 tablet by mouth daily.    [provider]  dorzolamide-timolol (COSOPT) 22.3-6.8 MG/ML ophthalmic solution Place 1 drop into both eyes 2 (two) times daily.  11/25/12   [provider]  Garlic 100 MG TABS Take 1 tablet by mouth daily. Patient not taking: Reported on 01/12/2020    [provider]  GLUTATHIONE PO Take 1 tablet by mouth daily. Patient not taking: Reported on 01/12/2020    [provider]  KRILL OIL PO Take 1 tablet by mouth daily.    [provider]  Multiple Vitamins-Minerals (MULTIVITAMIN WITH MINERALS) tablet Take 1 tablet by mouth daily.    [provider]  omeprazole (PRILOSEC) 40 MG capsule  01/26/13  [provider]  polyethylene glycol (MIRALAX / GLYCOLAX) packet Take 17 g by mouth daily. 01/07/18   Mesner, Barbara Cower, MD  thyroid (ARMOUR THYROID) 30 MG tablet Take 1 tablet by mouth daily.     [provider]  TRAVATAN Z 0.004 % SOLN ophthalmic solution Place 1 drop into both eyes at bedtime.  11/25/12   [provider]    Allergies    Patient has no known allergies.  Review of Systems   Review of Systems  All other systems reviewed and are negative. Ten systems reviewed and are negative for acute change, except as noted in the HPI.   Physical Exam Updated Vital Signs BP (!) 100/57 (BP Location: Right Arm)    Pulse 70   Temp (!) 97.5 F (36.4 C) (Oral)   Resp 16   Ht 5\' 11"  (1.803 m)   Wt 68 kg   SpO2 98%   BMI 20.92 kg/m    Physical Exam Vitals and nursing note reviewed.  Constitutional:      General: He is not in acute distress.    Appearance: Normal appearance. He is normal weight. He is not ill-appearing, toxic-appearing or diaphoretic.     Comments: Well-developed but cachectic elderly male.  Hard of hearing.  Son is at bedside and provides most of his history.  HENT:     Head: Normocephalic and atraumatic.     Right Ear: External ear normal.     Left Ear: External ear normal.     Nose: Nose normal.     Mouth/Throat:     Mouth: Mucous membranes are moist.     Pharynx: Oropharynx is clear. No oropharyngeal exudate or posterior oropharyngeal erythema.  Eyes:     Extraocular Movements: Extraocular movements intact.  Cardiovascular:     Rate and Rhythm: Normal rate and regular rhythm.     Pulses: Normal pulses.     Heart sounds: Normal heart sounds. No murmur heard.  No friction rub. No gallop.   Pulmonary:     Effort: Pulmonary effort is normal. No respiratory distress.     Breath sounds: Normal breath sounds. No stridor. No wheezing, rhonchi or rales.  Abdominal:     General: Abdomen is flat.     Tenderness: There is no abdominal tenderness.  Musculoskeletal:        General: Normal range of motion.     Cervical back: Normal range of motion and neck supple. No tenderness.  Skin:    General: Skin is warm and dry.     Comments: Bruising noted diffusely cross the extremities.  Mild TTP noted along the left superior calf.  Small amount of ecchymosis along the site.  No palpable cords.  2+ pedal and popliteal pulses noted bilaterally.  Lower extremities are warm and dry.  Distal sensation intact.  No visible leg swelling.  Neurological:     General: No focal deficit present.     Mental Status: He is alert and oriented to person, place, and time.  Psychiatric:        Mood and  Affect: Mood normal.        Behavior: Behavior normal.     ED Results / Procedures / Treatments   Labs (all labs ordered are listed, but only abnormal results are displayed) Labs Reviewed - No data to display  EKG None  Radiology No results found.  Procedures Procedures   Medications Ordered in ED Medications - No data to display  ED Course  I  have reviewed the triage vital signs and the nursing notes.  Pertinent labs & imaging results that were available during my care of the patient were reviewed by me and considered in my medical decision making (see chart for details).    MDM Rules/Calculators/A&P                          Pt is a 85 y.o. male that presents with a history, physical exam, and ED Clinical Course as noted above.   Patient was sent over by his hematologist for DVT rule out.  He has been taking Eliquis and is compliant with his medication.  Patient does have a small amount of ecchymosis on the superior portion of the left calf but otherwise no edema, significant tenderness, palpable cords.  His son told me on reevaluation that patient feels that his pain is improved over the last 24 hours.  Ultrasound is not available in this emergency department after noon on Saturday.  This was discussed with my attending physician as well.  I provided a referral for an outpatient ultrasound.  Patient and his son are planning on calling to schedule this for tomorrow and will then return to [facility] for his ultrasound.  They understand they can return to the ER overnight if he develops any new or worsening symptoms.  His son was very Adult nurse and is eager for them to be discharged.  His questions were answered and he was amicable at the time of discharge.  His father is slightly hypertensive at 100/57 but otherwise his vital signs are stable.  No tachycardia or hypoxia.  Note: Portions of this report may have been transcribed using voice recognition software. Every  effort was made to ensure accuracy; however, inadvertent computerized transcription errors may be present.   Final Clinical Impression(s) / ED Diagnoses Final diagnoses:  Pain of left calf   Rx / DC Orders ED Discharge Orders    None       Placido Sou, PA-C 01/21/20 1701    Eber Hong, MD 01/22/20 [id number]

## 2020-01-22 ENCOUNTER — Emergency Department (HOSPITAL_COMMUNITY)
Admission: RE | Admit: 2020-01-22 | Discharge: 2020-01-22 | Disposition: A | Discharge status: Home / Self Care | Payer: Medicare Other | Source: Ambulatory Visit | Attending: Physician Assistant | Admitting: Physician Assistant

## 2020-01-22 DIAGNOSIS — M79662 Pain in left lower leg: Secondary | ICD-10-CM | POA: Diagnosis not present

## 2020-01-22 NOTE — ED Provider Notes (Signed)
Patient came in today to get an ultrasound of his left calf. The study shows that he does have a DVT in the intramuscular or left gastrocnemius vein may be chronic or acute. Physical exam patient no longer has swelling or tenderness in the calf. I spoke with Dr. Ellin Saba and the patient will continue taking his Eliquis 5 mg twice a day and he will follow-up with Dr. Ellin Saba in 1 to 2 weeks   Bethann Berkshire, MD 01/22/20 1231

## 2020-02-03 ENCOUNTER — Other Ambulatory Visit (HOSPITAL_COMMUNITY): Payer: Self-pay

## 2020-02-03 DIAGNOSIS — I2699 Other pulmonary embolism without acute cor pulmonale: Secondary | ICD-10-CM

## 2020-02-06 ENCOUNTER — Inpatient Hospital Stay (HOSPITAL_COMMUNITY): Payer: Medicare Other | Attending: Hematology

## 2020-02-06 ENCOUNTER — Other Ambulatory Visit: Payer: Self-pay

## 2020-02-06 ENCOUNTER — Other Ambulatory Visit (HOSPITAL_COMMUNITY): Payer: Self-pay | Admitting: *Deleted

## 2020-02-06 ENCOUNTER — Inpatient Hospital Stay (HOSPITAL_BASED_OUTPATIENT_CLINIC_OR_DEPARTMENT_OTHER): Payer: Medicare Other | Admitting: Hematology

## 2020-02-06 VITALS — BP 93/48 | HR 64 | Resp 16 | Wt 156.7 lb

## 2020-02-06 DIAGNOSIS — I2699 Other pulmonary embolism without acute cor pulmonale: Secondary | ICD-10-CM

## 2020-02-06 DIAGNOSIS — Z950 Presence of cardiac pacemaker: Secondary | ICD-10-CM | POA: Diagnosis not present

## 2020-02-06 DIAGNOSIS — G20 Parkinson's disease: Secondary | ICD-10-CM | POA: Diagnosis not present

## 2020-02-06 DIAGNOSIS — Z7901 Long term (current) use of anticoagulants: Secondary | ICD-10-CM | POA: Diagnosis not present

## 2020-02-06 DIAGNOSIS — Z86718 Personal history of other venous thrombosis and embolism: Secondary | ICD-10-CM | POA: Insufficient documentation

## 2020-02-06 DIAGNOSIS — Z79899 Other long term (current) drug therapy: Secondary | ICD-10-CM | POA: Diagnosis not present

## 2020-02-06 DIAGNOSIS — Z86711 Personal history of pulmonary embolism: Secondary | ICD-10-CM | POA: Diagnosis present

## 2020-02-06 DIAGNOSIS — F0280 Dementia in other diseases classified elsewhere without behavioral disturbance: Secondary | ICD-10-CM | POA: Diagnosis not present

## 2020-02-06 LAB — D-DIMER, QUANTITATIVE: D-Dimer, Quant: 0.43 ug/mL-FEU (ref 0.00–0.50)

## 2020-02-06 MED ORDER — INFLUENZA VAC A&B SA ADJ QUAD 0.5 ML IM PRSY: 0.5000 mL | PREFILLED_SYRINGE | Freq: Once | INTRAMUSCULAR | Status: DC

## 2020-02-06 MED ORDER — ENOXAPARIN SODIUM 100 MG/ML ~~LOC~~ SOLN: 100.0000 mg | SUBCUTANEOUS | 0 refills | Status: DC

## 2020-02-06 NOTE — Progress Notes (Signed)
[facility] [address], [place] [address]   CLINIC:  Medical Oncology/Hematology  PCP:  System, Provider Not In None  None  REASON FOR VISIT:  Follow-up for PE  PRIOR THERAPY: Eliquis for 6 months in 01/2013  CURRENT THERAPY: Eliquis  INTERVAL HISTORY:  [name], a 85 y.o. male, returns for routine follow-up for his PE. Gary Sellers was last seen on 01/12/2020.  Today he is accompanied by his son and he reports feeling well. He denies having any nosebleeds, hematochezia or hematuria. He denies having any SOB or dyspnea. He denies having any soreness in his leg.  He is having his surgery on 10/26 at [facility].   REVIEW OF SYSTEMS:  Review of Systems  Constitutional: Positive for appetite change (25%) and fatigue (depleted).  HENT:   Positive for trouble swallowing (occasional). Negative for nosebleeds.   Respiratory: Positive for cough (occasional) and shortness of breath (occasional).   Gastrointestinal: Negative for blood in stool.  Genitourinary: Positive for frequency. Negative for hematuria.   Musculoskeletal: Negative for myalgias.  Psychiatric/Behavioral: Positive for sleep disturbance.  All other systems reviewed and are negative.   PAST MEDICAL/SURGICAL HISTORY:  Past Medical History:  Diagnosis Date  . Dementia (HCC)   . Parkinson disease [facility])    Past Surgical History:  Procedure Laterality Date  . DEEP BRAIN STIMULATOR PLACEMENT    . PACEMAKER PLACEMENT      SOCIAL HISTORY:  Social History   Socioeconomic History  . Marital status: Married    Spouse name: Not on file  . Number of children: Not on file  . Years of education: Not on file  . Highest education level: Not on file  Occupational History  . Not on file  Tobacco Use  . Smoking status: Never Smoker  . Smokeless tobacco: Never Used  Vaping Use  . Vaping Use: Never used  Substance and Sexual Activity  . Alcohol use: Never  . Drug use: Never  . Sexual  activity: Never  Other Topics Concern  . Not on file  Social History Narrative  . Not on file   Social Determinants of Health   Financial Resource Strain:   . Difficulty of Paying Living Expenses: Not on file  Food Insecurity:   . Worried About Programme researcher, broadcasting/film/video in the Last Year: Not on file  . Ran Out of Food in the Last Year: Not on file  Transportation Needs:   . Lack of Transportation (Medical): Not on file  . Lack of Transportation (Non-Medical): Not on file  Physical Activity:   . Days of Exercise per Week: Not on file  . Minutes of Exercise per Session: Not on file  Stress:   . Feeling of Stress : Not on file  Social Connections:   . Frequency of Communication with Friends and Family: Not on file  . Frequency of Social Gatherings with Friends and Family: Not on file  . Attends Religious Services: Not on file  . Active Member of Clubs or Organizations: Not on file  . Attends Banker Meetings: Not on file  . Marital Status: Not on file  Intimate Partner Violence:   . Fear of Current or Ex-Partner: Not on file  . Emotionally Abused: Not on file  . Physically Abused: Not on file  . Sexually Abused: Not on file    FAMILY HISTORY:  No family history on file.  CURRENT MEDICATIONS:  Current Outpatient Medications  Medication Sig Dispense Refill  .  apixaban (ELIQUIS) 5 MG TABS tablet Take 1 tablet (5 mg total) by mouth 2 (two) times daily. 120 tablet 2  . ascorbic acid (VITAMIN C) 500 MG tablet Take 1 tablet by mouth 2 (two) times daily.    . carbidopa-levodopa (SINEMET IR) 25-100 MG tablet Take 1 tablet by mouth every 3 (three) hours.    . Coenzyme Q10 10 MG capsule Take 1 tablet by mouth daily.     Marland Kitchen. DHEA 25 MG CAPS Take 1 tablet by mouth daily.    . dorzolamide-timolol (COSOPT) 22.3-6.8 MG/ML ophthalmic solution Place 1 drop into both eyes 2 (two) times daily.     . Garlic 100 MG TABS Take 1 tablet by mouth daily.     Marland Kitchen. GLUTATHIONE PO Take 1 tablet by  mouth daily.     Marland Kitchen. KRILL OIL PO Take 1 tablet by mouth daily.    . Multiple Vitamins-Minerals (MULTIVITAMIN WITH MINERALS) tablet Take 1 tablet by mouth daily.    Marland Kitchen. omeprazole (PRILOSEC) 40 MG capsule     . polyethylene glycol (MIRALAX / GLYCOLAX) packet Take 17 g by mouth daily. 14 each 0  . tamsulosin (FLOMAX) 0.4 MG CAPS capsule Take 0.4 mg by mouth 3 (three) times daily. Take two capsule three times daily    . thyroid (ARMOUR THYROID) 30 MG tablet Take 1 tablet by mouth daily.     . TRAVATAN Z 0.004 % SOLN ophthalmic solution Place 1 drop into both eyes at bedtime.     Marland Kitchen. apixaban (ELIQUIS) 5 MG TABS tablet Take 2 tablets (10 mg total) by mouth 2 (two) times daily for 7 days. 28 tablet 0  . enoxaparin (LOVENOX) 100 MG/ML injection Inject 1 mL (100 mg total) into the skin daily. 3 mL 0   Current Facility-Administered Medications  Medication Dose Route Frequency Provider Last Rate Last Admin  . influenza vaccine adjuvanted (FLUAD) injection 0.5 mL  0.5 mL Intramuscular Once Doreatha MassedKatragadda, Lyndal Reggio, MD        ALLERGIES:  No Known Allergies  PHYSICAL EXAM:  Performance status (ECOG): 1 - Symptomatic but completely ambulatory  Vitals:   02/06/20 1028  BP: (!) 93/48  Pulse: 64  Resp: 16  SpO2: 100%   Wt Readings from Last 3 Encounters:  02/06/20 156 lb 11.2 oz (71.1 kg)  01/21/20 150 lb (68 kg)  01/02/20 145 lb 8.1 oz (66 kg)   Physical Exam Vitals reviewed.  Constitutional:      Appearance: Normal appearance.  Cardiovascular:     Rate and Rhythm: Normal rate and regular rhythm.     Pulses: Normal pulses.     Heart sounds: Normal heart sounds.  Pulmonary:     Effort: Pulmonary effort is normal.     Breath sounds: Normal breath sounds.  Neurological:     General: No focal deficit present.     Mental Status: He is alert and oriented to person, place, and time.  Psychiatric:        Mood and Affect: Mood normal.        Behavior: Behavior normal.     LABORATORY DATA:  I  have reviewed the labs as listed.  CBC Latest Ref Rng & Units 01/04/2020 01/03/2020 01/02/2020  WBC 4.0 - 10.5 K/uL 12.2(H) 8.0 9.9  Hemoglobin 13.0 - 17.0 g/dL 12.9(L) 12.8(L) 12.8(L)  Hematocrit 39 - 52 % 40.6 39.7 40.4  Platelets 150 - 400 K/uL 315 284 252   CMP Latest Ref Rng & Units 01/04/2020 01/03/2020 01/02/2020  Glucose 70 - 99 mg/dL 664(Q) 034(V) 425(Z)  BUN 8 - 23 mg/dL 56(L) 87(F) 64(P)  Creatinine 0.61 - 1.24 mg/dL 3.29 5.18 8.41  Sodium 135 - 145 mmol/L 136 138 139  Potassium 3.5 - 5.1 mmol/L 4.1 4.1 3.9  Chloride 98 - 111 mmol/L 102 104 103  CO2 22 - 32 mmol/L 26 26 26   Calcium 8.9 - 10.3 mg/dL 9.3 9.3 9.3  Total Protein 6.5 - 8.1 g/dL - - -  Total Bilirubin 0.3 - 1.2 mg/dL - - -  Alkaline Phos 38 - 126 U/L - - -  AST 15 - 41 U/L - - -  ALT 0 - 44 U/L - - -      Component Value Date/Time   RBC 4.31 01/04/2020 0615   MCV 94.2 01/04/2020 0615   MCH 29.9 01/04/2020 0615   MCHC 31.8 01/04/2020 0615   RDW 12.7 01/04/2020 0615   LYMPHSABS 0.8 01/07/2018 1858   MONOABS 1.0 01/07/2018 1858   EOSABS 0.1 01/07/2018 1858   BASOSABS 0.1 01/07/2018 1858    DIAGNOSTIC IMAGING:  I have independently reviewed the scans and discussed with the patient. 01/09/2018 Venous Img Lower Unilateral Left  Result Date: 01/22/2020 CLINICAL DATA:  85 year old male with a history of possible left calf DVT EXAM: LEFT LOWER EXTREMITY VENOUS DOPPLER ULTRASOUND TECHNIQUE: Gray-scale sonography with graded compression, as well as color Doppler and duplex ultrasound were performed to evaluate the lower extremity deep venous systems from the level of the common femoral vein and including the common femoral, femoral, profunda femoral, popliteal and calf veins including the posterior tibial, peroneal and gastrocnemius veins when visible. The superficial great saphenous vein was also interrogated. Spectral Doppler was utilized to evaluate flow at rest and with distal augmentation maneuvers in the common femoral,  femoral and popliteal veins. COMPARISON:  None. FINDINGS: Contralateral Common Femoral Vein: Respiratory phasicity is normal and symmetric with the symptomatic side. No evidence of thrombus. Normal compressibility. Common Femoral Vein: No evidence of thrombus. Normal compressibility, respiratory phasicity and response to augmentation. Saphenofemoral Junction: No evidence of thrombus. Normal compressibility and flow on color Doppler imaging. Profunda Femoral Vein: No evidence of thrombus. Normal compressibility and flow on color Doppler imaging. Femoral Vein: No evidence of thrombus. Normal compressibility, respiratory phasicity and response to augmentation. Popliteal Vein: No evidence of thrombus. Normal compressibility, respiratory phasicity and response to augmentation. Calf Veins: Patent posterior tibial vein and peroneal vein. The intra muscular deep vein, gastrocs vein is incompressible with echogenic luminal defect. Superficial Great Saphenous Vein: No evidence of thrombus. Normal compressibility and flow on color Doppler imaging. Other Findings:  None. IMPRESSION: Sonographic survey of the left lower extremity negative for proximal DVT, including negative exam of the common femoral, profunda, femoral vein, and popliteal vein. Negative for DVT of the peroneal and posterior tibial veins. The study is positive for DVT of the intramuscular left gastrocnemius vein without comparison, which may be chronic versus acute on chronic. Electronically Signed   By: 91 D.O.   On: 01/22/2020 11:20     ASSESSMENT:  1.  Unprovoked pulmonary embolism: -CT angio on 12/28/2019 shows acute bilateral pulmonary emboli with CT evidence of right heart strain. -Patient was not severely immobile although his mobility is slightly limited by Parkinson's disease. -He is currently on Eliquis.  He is currently a resident at [facility] nursing home in [place]. -He had DVT/PE in the leg few years ago (October 2014)  and was treated with 6 months  of anticoagulation. -Reviewed labs from recent hospitalization from 01/01/2020.  Factor V Leiden, prothrombin gene mutation, lupus anticoagulant, beta-2 glycoprotein 1 antibody, anticardiolipin antibody were negative.  Protein C&S levels were normal.  Antithrombin III was normal. -As he had unprovoked to weakly provoked pulmonary embolism with right heart strain, I have recommended indefinite anticoagulation at this time.  He also had DVT/PE in 2014, precipitating circumstances unknown.  Patient is a poor historian.   2.  Parkinson's disease: -He has deep brain stimulator (DBS) placed at [facility] in 2009.  He has dysarthria and hypophonia. -Battery to be changed on 02/14/2020.   PLAN:  1.  Unprovoked pulmonary embolism: -Went to the ER on 01/22/2020 with left leg pain. -Doppler showed intramuscular left gastrocnemius vein DVT, which may be chronic versus acute on chronic. -He is continuing Eliquis twice daily.  D-dimer on 02/06/2020 was 0.43. -He is having battery change for his deep brain stimulator on 02/14/2020. -I have recommended bridging with Lovenox 1.5 mg/kg once daily.  He will take Lovenox starting 3 days prior to his planned surgery.  He will not take the dose of Lovenox on the day of surgery. -He will start back on Eliquis the evening of surgery. -I plan to see him back in 6 months for follow-up.    Orders placed this encounter:  Orders Placed This Encounter  Procedures  . D-dimer, quantitative     Doreatha Massed, MD [facility] [phone]   I, Drue Second, am acting as a scribe for Dr. Payton Mccallum.  I, Doreatha Massed MD, have reviewed the above documentation for accuracy and completeness, and I agree with the above.

## 2020-02-06 NOTE — Patient Instructions (Signed)
[facility] at [facility] Discharge Instructions  You were seen today by Dr. Ellin Saba. He went over your recent results and scans. Stop taking the Eliquis 3 days prior to the procedure and you will be prescribed Lovenox to take for 2 days, but not on the day of the procedure. You can restart taking the Eliquis on the evening after the procedure. Your next appointment will be in 6 months with the nurse practitioner for labs and follow up.   Thank you for choosing [facility] at [facility] to provide your oncology and hematology care.  To afford each patient quality time with our provider, please arrive at least 15 minutes before your scheduled appointment time.   If you have a lab appointment with the Cancer Center please come in thru the Main Entrance and check in at the main information desk  You need to re-schedule your appointment should you arrive 10 or more minutes late.  We strive to give you quality time with our providers, and arriving late affects you and other patients whose appointments are after yours.  Also, if you no show three or more times for appointments you may be dismissed from the clinic at the providers discretion.     Again, thank you for choosing [facility].  Our hope is that these requests will decrease the amount of time that you wait before being seen by our physicians.       _____________________________________________________________  Should you have questions after your visit to [facility], please contact our office at [phone] between the hours of 8:00 a.m. and 4:30 p.m.  Voicemails left after 4:00 p.m. will not be returned until the following business day.  For prescription refill requests, have your pharmacy contact our office and allow 72 hours.    Cancer Center Support Programs:   > Cancer Support Group  2nd Tuesday of the month 1pm-2pm, Journey Room

## 2020-02-06 NOTE — Progress Notes (Signed)
Patient and his son instructed on the administration of lovenox.  Patient's son states that he understands and was able to re-teach about the injection.  Patient will call if they have any questions.

## 2020-02-07 ENCOUNTER — Encounter (HOSPITAL_COMMUNITY): Payer: Self-pay

## 2020-02-07 ENCOUNTER — Other Ambulatory Visit (HOSPITAL_COMMUNITY): Payer: Self-pay | Admitting: *Deleted

## 2020-02-07 MED ORDER — APIXABAN 5 MG PO TABS: 5.0000 mg | ORAL_TABLET | Freq: Two times a day (BID) | ORAL | 2 refills | Status: DC

## 2020-05-08 ENCOUNTER — Other Ambulatory Visit (HOSPITAL_COMMUNITY): Payer: Medicare Other

## 2020-05-15 ENCOUNTER — Ambulatory Visit (HOSPITAL_COMMUNITY): Payer: Medicare Other | Admitting: Hematology

## 2020-05-21 ENCOUNTER — Other Ambulatory Visit (HOSPITAL_COMMUNITY): Payer: Self-pay | Admitting: Specialist

## 2020-05-21 DIAGNOSIS — R1319 Other dysphagia: Secondary | ICD-10-CM

## 2020-05-22 ENCOUNTER — Ambulatory Visit (HOSPITAL_COMMUNITY): Payer: Medicare Other | Admitting: Speech Pathology

## 2020-05-23 ENCOUNTER — Telehealth (HOSPITAL_COMMUNITY): Payer: Self-pay | Admitting: Speech Pathology

## 2020-05-23 NOTE — Telephone Encounter (Signed)
S/w wife she will call homehealth and let them know He has a swallowing test on 2/14 and they will need to come on another date to met with them about homehealth. °

## 2020-05-28 ENCOUNTER — Ambulatory Visit (HOSPITAL_COMMUNITY): Payer: Medicare Other | Admitting: Speech Pathology

## 2020-05-29 ENCOUNTER — Other Ambulatory Visit (HOSPITAL_COMMUNITY): Payer: Medicare Other

## 2020-06-04 ENCOUNTER — Ambulatory Visit (HOSPITAL_COMMUNITY): Payer: Medicare Other | Admitting: Speech Pathology

## 2020-06-04 ENCOUNTER — Other Ambulatory Visit (HOSPITAL_COMMUNITY): Payer: Medicare Other

## 2020-06-07 ENCOUNTER — Emergency Department (HOSPITAL_COMMUNITY)
Admission: EM | Admit: 2020-06-07 | Discharge: 2020-06-07 | Disposition: A | Discharge status: Home / Self Care | Payer: Medicare Other | Attending: Emergency Medicine | Admitting: Emergency Medicine

## 2020-06-07 ENCOUNTER — Ambulatory Visit (HOSPITAL_COMMUNITY): Payer: Medicare Other | Attending: Neurology | Admitting: Speech Pathology

## 2020-06-07 ENCOUNTER — Ambulatory Visit (HOSPITAL_COMMUNITY)
Admission: RE | Admit: 2020-06-07 | Discharge: 2020-06-07 | Disposition: A | Discharge status: Home / Self Care | Payer: Medicare Other | Source: Ambulatory Visit | Attending: Neurology | Admitting: Neurology

## 2020-06-07 ENCOUNTER — Other Ambulatory Visit: Payer: Self-pay

## 2020-06-07 ENCOUNTER — Encounter (HOSPITAL_COMMUNITY): Payer: Self-pay | Admitting: Speech Pathology

## 2020-06-07 ENCOUNTER — Emergency Department (HOSPITAL_COMMUNITY): Payer: Medicare Other

## 2020-06-07 ENCOUNTER — Encounter (HOSPITAL_COMMUNITY): Payer: Self-pay

## 2020-06-07 DIAGNOSIS — R1319 Other dysphagia: Secondary | ICD-10-CM

## 2020-06-07 DIAGNOSIS — G20 Parkinson's disease: Secondary | ICD-10-CM | POA: Insufficient documentation

## 2020-06-07 DIAGNOSIS — R1312 Dysphagia, oropharyngeal phase: Secondary | ICD-10-CM | POA: Diagnosis not present

## 2020-06-07 DIAGNOSIS — Z7901 Long term (current) use of anticoagulants: Secondary | ICD-10-CM | POA: Insufficient documentation

## 2020-06-07 DIAGNOSIS — Z950 Presence of cardiac pacemaker: Secondary | ICD-10-CM | POA: Diagnosis not present

## 2020-06-07 DIAGNOSIS — R531 Weakness: Secondary | ICD-10-CM

## 2020-06-07 DIAGNOSIS — F0390 Unspecified dementia without behavioral disturbance: Secondary | ICD-10-CM | POA: Diagnosis not present

## 2020-06-07 DIAGNOSIS — M6281 Muscle weakness (generalized): Secondary | ICD-10-CM | POA: Insufficient documentation

## 2020-06-07 HISTORY — DX: Gastro-esophageal reflux disease without esophagitis: K21.9

## 2020-06-07 HISTORY — DX: Disorder of thyroid, unspecified: E07.9

## 2020-06-07 LAB — CBC WITH DIFFERENTIAL/PLATELET
Abs Immature Granulocytes: 0.03 10*3/uL (ref 0.00–0.07)
Basophils Absolute: 0.1 10*3/uL (ref 0.0–0.1)
Basophils Relative: 1 %
Eosinophils Absolute: 0.4 10*3/uL (ref 0.0–0.5)
Eosinophils Relative: 6 %
HCT: 42.6 % (ref 39.0–52.0)
Hemoglobin: 13.7 g/dL (ref 13.0–17.0)
Immature Granulocytes: 0 %
Lymphocytes Relative: 20 %
Lymphs Abs: 1.4 10*3/uL (ref 0.7–4.0)
MCH: 30.6 pg (ref 26.0–34.0)
MCHC: 32.2 g/dL (ref 30.0–36.0)
MCV: 95.3 fL (ref 80.0–100.0)
Monocytes Absolute: 0.7 10*3/uL (ref 0.1–1.0)
Monocytes Relative: 10 %
Neutro Abs: 4.4 10*3/uL (ref 1.7–7.7)
Neutrophils Relative %: 63 %
Platelets: 230 10*3/uL (ref 150–400)
RBC: 4.47 MIL/uL (ref 4.22–5.81)
RDW: 13.2 % (ref 11.5–15.5)
WBC: 7.1 10*3/uL (ref 4.0–10.5)
nRBC: 0 % (ref 0.0–0.2)

## 2020-06-07 LAB — URINALYSIS, ROUTINE W REFLEX MICROSCOPIC
Bilirubin Urine: NEGATIVE
Glucose, UA: NEGATIVE mg/dL
Hgb urine dipstick: NEGATIVE
Ketones, ur: 5 mg/dL — AB
Leukocytes,Ua: NEGATIVE
Nitrite: NEGATIVE
Protein, ur: NEGATIVE mg/dL
Specific Gravity, Urine: 1.014 (ref 1.005–1.030)
pH: 6 (ref 5.0–8.0)

## 2020-06-07 LAB — COMPREHENSIVE METABOLIC PANEL
ALT: 6 U/L (ref 0–44)
AST: 19 U/L (ref 15–41)
Albumin: 3.6 g/dL (ref 3.5–5.0)
Alkaline Phosphatase: 63 U/L (ref 38–126)
Anion gap: 6 (ref 5–15)
BUN: 31 mg/dL — ABNORMAL HIGH (ref 8–23)
CO2: 24 mmol/L (ref 22–32)
Calcium: 8.7 mg/dL — ABNORMAL LOW (ref 8.9–10.3)
Chloride: 104 mmol/L (ref 98–111)
Creatinine, Ser: 1.15 mg/dL (ref 0.61–1.24)
GFR, Estimated: >60 mL/min (ref >60)
Glucose, Bld: 101 mg/dL — ABNORMAL HIGH (ref 70–99)
Potassium: 4.2 mmol/L (ref 3.5–5.1)
Sodium: 134 mmol/L — ABNORMAL LOW (ref 135–145)
Total Bilirubin: 0.6 mg/dL (ref 0.3–1.2)
Total Protein: 6 g/dL — ABNORMAL LOW (ref 6.5–8.1)

## 2020-06-07 NOTE — ED Triage Notes (Signed)
Wife reports pt c/o generalized weakness and more unsteady on his feet than usual since waking up yesterday morning.  Wife says pt fell last weekend but did not hit head.  Pt went to [place] this afternoon for evaluation of weakness and sent here for further evaluation.  Pt alert and oriented.  Denies any pain.

## 2020-06-07 NOTE — ED Provider Notes (Incomplete)
[facility] EMERGENCY DEPARTMENT Provider Note   CSN: [id number] Arrival date & time: 06/07/20  1524     History Chief Complaint  Patient presents with  . Weakness    Gary Sellers is a 86 y.o. male with a history of Parkinson's disease, thyroid disease, dementia, and GERD.  Patient's wife is at bedside she denies that patient has dementia.  Patient is found to be alert and oriented to person, place,time, and event.    Patient's wife reports that patient has had generalized weakness and been more unsteady on his feet.  She reports that the symptoms began yesterday and have been constant.   Weakness has not changed.  No alleviating or aggravating factors. She reports that he reports that patient walks with a rolling walker at home without difficulty.  She also reports that patient suffered a fall last week.  She denies that patient hit his head or have any loss of consciousness.          Past Medical History:  Diagnosis Date  . Dementia [facility](HCC)    wife denies history of dementia.  Marland Kitchen. GERD (gastroesophageal reflux disease)   . Parkinson disease (HCC)   . Thyroid disease     Patient Active Problem List   Diagnosis Date Noted  . PE (pulmonary thromboembolism) (HCC) 12/28/2019    Past Surgical History:  Procedure Laterality Date  . DEEP BRAIN STIMULATOR PLACEMENT    . PACEMAKER PLACEMENT         No family history on file.  Social History   Tobacco Use  . Smoking status: Never Smoker  . Smokeless tobacco: Never Used  Vaping Use  . Vaping Use: Never used  Substance Use Topics  . Alcohol use: Never  . Drug use: Never    Home Medications Prior to Admission medications   Medication Sig Start Date End Date Taking? Authorizing Provider  apixaban (ELIQUIS) 5 MG TABS tablet Take 1 tablet (5 mg total) by mouth 2 (two) times daily. 02/07/20 04/07/20  Doreatha MassedKatragadda, Sreedhar, MD  ascorbic acid (VITAMIN C) 500 MG tablet Take 1 tablet by mouth 2 (two) times daily.     [provider]  carbidopa-levodopa (SINEMET IR) 25-100 MG tablet Take 1 tablet by mouth every 3 (three) hours. 12/09/19   [provider]  Coenzyme Q10 10 MG capsule Take 1 tablet by mouth daily.     [provider]  DHEA 25 MG CAPS Take 1 tablet by mouth daily.    [provider]  dorzolamide-timolol (COSOPT) 22.3-6.8 MG/ML ophthalmic solution Place 1 drop into both eyes 2 (two) times daily.  11/25/12   [provider]  enoxaparin (LOVENOX) 100 MG/ML injection Inject 1 mL (100 mg total) into the skin daily. 02/06/20   Doreatha MassedKatragadda, Sreedhar, MD  Garlic 100 MG TABS Take 1 tablet by mouth daily.     [provider]  GLUTATHIONE PO Take 1 tablet by mouth daily.     [provider]  KRILL OIL PO Take 1 tablet by mouth daily.    [provider]  Multiple Vitamins-Minerals (MULTIVITAMIN WITH MINERALS) tablet Take 1 tablet by mouth daily.    [provider]  omeprazole (PRILOSEC) 40 MG capsule  01/26/13   [provider]  polyethylene glycol (MIRALAX / GLYCOLAX) packet Take 17 g by mouth daily. 01/07/18   Mesner, Barbara CowerJason, MD  tamsulosin (FLOMAX) 0.4 MG CAPS capsule Take 0.4 mg by mouth 3 (three) times daily. Take two capsule three times daily  [provider]  thyroid (ARMOUR THYROID) 30 MG tablet Take 1 tablet by mouth daily.     [provider]  TRAVATAN Z 0.004 % SOLN ophthalmic solution Place 1 drop into both eyes at bedtime.  11/25/12   [provider]    Allergies    Patient has no known allergies.  Review of Systems   Review of Systems  Physical Exam Updated Vital Signs BP (!) 143/81   Pulse 69   Temp (!) 97.5 F (36.4 C) (Oral)   Resp 18   Ht 5\' 11"  (1.803 m)   Wt 72.6 kg   SpO2 96%   BMI 22.32 kg/m   Physical Exam Vitals and nursing note reviewed.  Constitutional:      General: He is not in acute distress.    Appearance: He is not ill-appearing, toxic-appearing or  diaphoretic.  Eyes:     General: No scleral icterus.       Right eye: No discharge.        Left eye: No discharge.     Extraocular Movements: Extraocular movements intact.     Pupils: Pupils are equal, round, and reactive to light.  Cardiovascular:     Rate and Rhythm: Normal rate.  Pulmonary:     Effort: Pulmonary effort is normal.  Abdominal:     Palpations: Abdomen is soft.     Tenderness: There is no abdominal tenderness.  Musculoskeletal:     Cervical back: Normal range of motion and neck supple. No rigidity.  Skin:    General: Skin is warm and dry.  Neurological:     General: No focal deficit present.     Mental Status: He is alert.     GCS: GCS eye subscore is 4. GCS verbal subscore is 5. GCS motor subscore is 6.     Cranial Nerves: No cranial nerve deficit or facial asymmetry.     Motor: No weakness, tremor or seizure activity.     Coordination: Romberg sign negative. Finger-Nose-Finger Test normal.     Gait: Gait is intact. Gait normal.     Comments: CN II-XII intact, equal grip strength, +5 strength to bilateral upper and lower extremities   Patient was able to stand and take a few steps with assistance; patient was unsteady on his feet  Psychiatric:        Behavior: Behavior is cooperative.     ED Results / Procedures / Treatments   Labs (all labs ordered are listed, but only abnormal results are displayed) Labs Reviewed  URINALYSIS, ROUTINE W REFLEX MICROSCOPIC  COMPREHENSIVE METABOLIC PANEL  CBC WITH DIFFERENTIAL/PLATELET    EKG EKG Interpretation  Date/Time:  Thursday June 07 2020 16:12:03 EST Ventricular Rate:  67 PR Interval:    QRS Duration: 76 QT Interval:  412 QTC Calculation: 435 R Axis:   74 Text Interpretation: Accelerated Junctional rhythm Junctional ST depression, probably normal Abnormal ECG Interpretation limited secondary to artifact ? pacer artifact Confirmed by [phone] [id number]) on 06/07/2020 4:56:25 PM   Radiology DG OP  Swallowing Func-Medicare/Speech Path  Result Date: 06/07/2020 [facility] [address] [place], [place], [place] Phone: [phone]   Fax:  [phone]  Modified Barium Swallow  Patient Details Name: Gary Sellers MRN: Gary Sellers Date of Birth: 1934-12-19 No data recorded  Encounter Date: 06/07/2020        End of Session - 06/07/20 1232        Visit Number 1  Number of Visits 1    Authorization Type Medicare    SLP Start Time 1136    SLP Stop Time  1202    SLP Time Calculation (min) 26 min    Activity Tolerance Patient tolerated treatment well            Past Medical History: Diagnosis Date . Dementia (HCC)   . Parkinson disease [facility])         Past Surgical History: Procedure Laterality Date . DEEP BRAIN STIMULATOR PLACEMENT     . PACEMAKER PLACEMENT       There were no vitals filed for this visit.        Subjective Assessment - 06/07/20 1224        Subjective "Okay"    Patient is accompained by: Family member    Special Tests MBSS    Currently in Pain? No/denies                General - 06/07/20 1225          General Information   Date of Onset 05/22/20    HPI Gary Magnussen. is a 86 y.o. male who was referred for MBSS by his neurologist, Dr. Georga Hacking [facility] due to dysphagia (however spouse denies). Pt with PD s/p DBS. Briefly, he has had PD for 10+ years and underwent bilateral STN DBS placement in 2009 by Dr. Gladstone Pih at [facility]. Since then he has been following at the [facility] [place] and was most recently seen in May 2019 by Dr. Andria Meuse and Dr. Raford Pitcher. His DBS settings were unchanged but his Sinemet regimen was simplified to Sinemet CR 50/200, 1 tab QID. He had an IPG replacement by Dr Gladstone Pih in Sept 2019 which went well. He reports that he takes Sinemet at 7-11-3-7. He has a history of falls and usually falls backwards. He successfully underwent DBS battery replacement by Dr Mayford Knife in Oct 2021.    Type of Study MBS-Modified Barium Swallow Study    Previous  Swallow Assessment None on record    Diet Prior to this Study Regular;Thin liquids    Temperature Spikes Noted No    Respiratory Status Room air    History of Recent Intubation No    Behavior/Cognition Alert;Cooperative;Pleasant mood    Oral Cavity Assessment Within Functional Limits    Oral Care Completed by SLP No    Oral Cavity - Dentition Adequate natural dentition    Vision Functional for self feeding    Self-Feeding Abilities Able to feed self    Patient Positioning Upright in chair    Baseline Vocal Quality Normal untitled image  mild hypophonia   Volitional Cough Strong    Volitional Swallow Able to elicit    Anatomy Within functional limits    Pharyngeal Secretions Not observed secondary MBS                Oral Preparation/Oral Phase - 06/07/20 1226          Oral Preparation/Oral Phase   Oral Phase Within functional limits      Electrical stimulation - Oral Phase   Was Electrical Stimulation Used No               Pharyngeal Phase - 06/07/20 1226          Pharyngeal Phase   Pharyngeal Phase Impaired      Pharyngeal - Thin   Pharyngeal- Thin Teaspoon Swallow initiation at pyriform sinus    Pharyngeal- Thin Cup Swallow  initiation at pyriform sinus;Penetration/Aspiration during swallow;Reduced tongue base retraction;Pharyngeal residue - valleculae;Lateral channel residue;Pharyngeal residue - pyriform untitled image  trace/mild residuals and cleared with dry swallow   Pharyngeal Material does not enter airway;Material enters airway, remains ABOVE vocal cords then ejected out    Pharyngeal- Thin Straw Swallow initiation at pyriform sinus;Pharyngeal residue - valleculae;Pharyngeal residue - pyriform;Lateral channel residue      Pharyngeal - Solids   Pharyngeal- Puree Swallow initiation at vallecula;Within functional limits    Pharyngeal- Regular Within functional limits;Delayed swallow initiation-vallecula    Pharyngeal- Pill Within functional limits      Electrical Stimulation - Pharyngeal Phase   Was Electrical  Stimulation Used No               Cricopharyngeal Phase - 06/07/20 1228          Cervical Esophageal Phase   Cervical Esophageal Phase Impaired      Cervical Esophageal Phase - Thin   Thin Teaspoon Prominent cricopharyngeal segment      Cervical Esophageal Phase - Comment   Cervical Esophageal Comment Notable for prominent CP with small collection of barium immediately following which clears    Other Esophageal Phase Observations WNL during sweep               Plan - 06/07/20 1233        Clinical Impression Statement Pt presents with min oropharyngeal dysphagia in setting of advanced age and Parkinson's disease (Pt with DBS and takes Sinemet) characterized by min lingual weakness with premature spillage of liquids over the base of tongue with swallow trigger at the level of the pyriforms and min decreased tongue base retraction resulting in a single episode of flash penetration of tsp presentation thin barium (no aspiration) and min residuals along base of tongue, valleculae, and lateral channels post inititial swallow which cleared with spontaneous and occasionally cued repeat/dry swallow. Residuals only observed with liquids (tsp, cup, straw) and not visualized with puree or solids. Pt required three sips/swallows to clear the barium tablet over the base of tongue, otherwise WNL. Esophageal sweep completed and notable for a prominent cricopharyngeus with a small collection of barium immediately posterior, but cleared. Recommend regular textures and thin liquids via small bites/sips and remind Pt to swallow 2x for each sip (to clear pharyngeal residuals) and standard aspiration and reflux precautions. Pt initiated a spontaneous secondary/dry swallow ~50% of the time, so will benefit from reminders to "swallow again" especially if using straws, as the residuals occurred in greater amounts with straw sips. Results of this study were reviewed with Pt and spouse and recommendations were provided in written format along  with my contact information should they have further questions.    Potential to Achieve Goals Good    Consulted and Agree with Plan of Care Patient;Family member/caregiver    Family Member Consulted Spouse         Patient will benefit from skilled therapeutic intervention in order to improve the following deficits and impairments:  Dysphagia, oropharyngeal phase         Recommendations/Treatment - 06/07/20 1230          Swallow Evaluation Recommendations   SLP Diet Recommendations Age appropriate regular;Thin    Liquid Administration via Cup;Straw    Medication Administration Whole meds with liquid    Supervision Patient able to self feed    Compensations Slow rate;Small sips/bites;Multiple dry swallows after each bite/sip    Postural Changes Seated upright at 90 degrees;Remain upright for at  least 30 minutes after feeds/meals               Prognosis - 06/07/20 1230          Prognosis   Prognosis for Safe Diet Advancement Good    Barriers/Prognosis Comment Unsure if performance varies throughout the day +- when medication wears off      Individuals Consulted   Consulted and Agree with Results and Recommendations Patient;Family member/caregiver    Family Member Consulted Spouse    Report Sent to  Referring physician         Problem List    Patient Active Problem List   Diagnosis Date Noted . PE (pulmonary thromboembolism) (HCC) 12/28/2019  Thank you,  Havery Moros, CCC-SLP [phone]  [facility] 06/07/2020, 12:47 PM  [facility] [address] [place], [place], [address] Phone: [phone]   Fax:  [phone]  Name: Gary Sellers MRN: [id number] Date of Birth: Sep 30, 1934    CLINICAL DATA:  Dysphagia. Parkinson's disease, has deep brain stimulator EXAM: MODIFIED BARIUM SWALLOW TECHNIQUE: Different consistencies of barium were administered orally to the patient by the Speech Pathologist. Imaging of the pharynx was performed in the lateral projection. The radiologist was  present in the fluoroscopy room for this study, providing personal supervision. FLUOROSCOPY TIME:  Fluoroscopy Time:  2 minutes 12 seconds Radiation Exposure Index (if provided by the fluoroscopic device): 9.0 mGy Number of Acquired Spot Images: multiple fluoroscopic screen captures COMPARISON:  None FINDINGS: Single episode of flash laryngeal penetration of thin barium by cup. No aspiration. No residuals. Applesauce and cracker consistencies unremarkable. Patient swallowed a 12.5 mm diameter barium tablet with thin barium without difficulty. The barium tablet passed to the stomach without obstruction. IMPRESSION: Single episode of flash laryngeal penetration of thin barium. Please refer to the Speech Pathologists report for complete details and recommendations. Electronically Signed   By: Ulyses Southward M.D.   On: 06/07/2020 13:44    Procedures Procedures {Remember to document critical care time when appropriate:1}  Medications Ordered in ED Medications - No data to display  ED Course  I have reviewed the triage vital signs and the nursing notes.  Pertinent labs & imaging results that were available during my care of the patient were reviewed by me and considered in my medical decision making (see chart for details).    MDM Rules/Calculators/A&P                          *** Final Clinical Impression(s) / ED Diagnoses Final diagnoses:  None    Rx / DC Orders ED Discharge Orders    None

## 2020-06-07 NOTE — ED Notes (Signed)
Patient transported to CT 

## 2020-06-07 NOTE — Therapy (Signed)
[facility] [address] [place], [place], [address] Phone: [phone]   Fax:  [phone]  Modified Barium Swallow  Patient Details  Name: Gary Sellers MRN: [id number] Date of Birth: 1934/06/19 No data recorded  Encounter Date: 06/07/2020   End of Session - 06/07/20 1232    Visit Number 1    Number of Visits 1    Authorization Type Medicare    SLP Start Time 1136    SLP Stop Time  1202    SLP Time Calculation (min) 26 min    Activity Tolerance Patient tolerated treatment well           Past Medical History:  Diagnosis Date  . Dementia (HCC)   . Parkinson disease [facility])     Past Surgical History:  Procedure Laterality Date  . DEEP BRAIN STIMULATOR PLACEMENT    . PACEMAKER PLACEMENT      There were no vitals filed for this visit.   Subjective Assessment - 06/07/20 1224    Subjective "Okay"    Patient is accompained by: Family member    Special Tests MBSS    Currently in Pain? No/denies               General - 06/07/20 1225      General Information   Date of Onset 05/22/20    HPI Gary Sellers. is a 86 y.o. male who was referred for MBSS by his neurologist, Dr. Georga Hacking [facility] due to dysphagia (however spouse denies). Pt with PD s/p DBS. Briefly, he has had PD for 10+ years and underwent bilateral STN DBS placement in 2009 by Dr. Gladstone Pih at [facility]. Since then he has been following at the [facility] [place] and was most recently seen in May 2019 by Dr. Andria Meuse and Dr. Raford Pitcher. His DBS settings were unchanged but his Sinemet regimen was simplified to Sinemet CR 50/200, 1 tab QID. He had an IPG replacement by Dr Gladstone Pih in Sept 2019 which went well. He reports that he takes Sinemet at 7-11-3-7. He has a history of falls and usually falls backwards. He successfully underwent DBS battery replacement by Dr Mayford Knife in Oct 2021.    Type of Study MBS-Modified Barium Swallow Study    Previous Swallow Assessment None on  record    Diet Prior to this Study Regular;Thin liquids    Temperature Spikes Noted No    Respiratory Status Room air    History of Recent Intubation No    Behavior/Cognition Alert;Cooperative;Pleasant mood    Oral Cavity Assessment Within Functional Limits    Oral Care Completed by SLP No    Oral Cavity - Dentition Adequate natural dentition    Vision Functional for self feeding    Self-Feeding Abilities Able to feed self    Patient Positioning Upright in chair    Baseline Vocal Quality Normal   mild hypophonia   Volitional Cough Strong    Volitional Swallow Able to elicit    Anatomy Within functional limits    Pharyngeal Secretions Not observed secondary MBS              Oral Preparation/Oral Phase - 06/07/20 1226      Oral Preparation/Oral Phase   Oral Phase Within functional limits      Electrical stimulation - Oral Phase   Was Electrical Stimulation Used No            Pharyngeal Phase - 06/07/20 1226      Pharyngeal  Phase   Pharyngeal Phase Impaired      Pharyngeal - Thin   Pharyngeal- Thin Teaspoon Swallow initiation at pyriform sinus    Pharyngeal- Thin Cup Swallow initiation at pyriform sinus;Penetration/Aspiration during swallow;Reduced tongue base retraction;Pharyngeal residue - valleculae;Lateral channel residue;Pharyngeal residue - pyriform   trace/mild residuals and cleared with dry swallow   Pharyngeal Material does not enter airway;Material enters airway, remains ABOVE vocal cords then ejected out    Pharyngeal- Thin Straw Swallow initiation at pyriform sinus;Pharyngeal residue - valleculae;Pharyngeal residue - pyriform;Lateral channel residue      Pharyngeal - Solids   Pharyngeal- Puree Swallow initiation at vallecula;Within functional limits    Pharyngeal- Regular Within functional limits;Delayed swallow initiation-vallecula    Pharyngeal- Pill Within functional limits      Electrical Stimulation - Pharyngeal Phase   Was Electrical Stimulation  Used No            Cricopharyngeal Phase - 06/07/20 1228      Cervical Esophageal Phase   Cervical Esophageal Phase Impaired      Cervical Esophageal Phase - Thin   Thin Teaspoon Prominent cricopharyngeal segment      Cervical Esophageal Phase - Comment   Cervical Esophageal Comment Notable for prominent CP with small collection of barium immediately following which clears    Other Esophageal Phase Observations WNL during sweep             Plan - 06/07/20 1233    Clinical Impression Statement Pt presents with min oropharyngeal dysphagia in setting of advanced age and Parkinson's disease (Pt with DBS and takes Sinemet) characterized by min lingual weakness with premature spillage of liquids over the base of tongue with swallow trigger at the level of the pyriforms and min decreased tongue base retraction resulting in a single episode of flash penetration of tsp presentation thin barium (no aspiration) and min residuals along base of tongue, valleculae, and lateral channels post inititial swallow which cleared with spontaneous and occasionally cued repeat/dry swallow. Residuals only observed with liquids (tsp, cup, straw) and not visualized with puree or solids. Pt required three sips/swallows to clear the barium tablet over the base of tongue, otherwise WNL. Esophageal sweep completed and notable for a prominent cricopharyngeus with a small collection of barium immediately posterior, but cleared. Recommend regular textures and thin liquids via small bites/sips and remind Pt to swallow 2x for each sip (to clear pharyngeal residuals) and standard aspiration and reflux precautions. Pt initiated a spontaneous secondary/dry swallow ~50% of the time, so will benefit from reminders to "swallow again" especially if using straws, as the residuals occurred in greater amounts with straw sips. Results of this study were reviewed with Pt and spouse and recommendations were provided in written format along  with my contact information should they have further questions.    Potential to Achieve Goals Good    Consulted and Agree with Plan of Care Patient;Family member/caregiver    Family Member Consulted Spouse           Patient will benefit from skilled therapeutic intervention in order to improve the following deficits and impairments:   Dysphagia, oropharyngeal phase     Recommendations/Treatment - 06/07/20 1230      Swallow Evaluation Recommendations   SLP Diet Recommendations Age appropriate regular;Thin    Liquid Administration via Cup;Straw    Medication Administration Whole meds with liquid    Supervision Patient able to self feed    Compensations Slow rate;Small sips/bites;Multiple dry swallows after each bite/sip  Postural Changes Seated upright at 90 degrees;Remain upright for at least 30 minutes after feeds/meals            Prognosis - 06/07/20 1230      Prognosis   Prognosis for Safe Diet Advancement Good    Barriers/Prognosis Comment Unsure if performance varies throughout the day +- when medication wears off      Individuals Consulted   Consulted and Agree with Results and Recommendations Patient;Family member/caregiver    Family Member Consulted Spouse    Report Sent to  Referring physician           Problem List Patient Active Problem List   Diagnosis Date Noted  . PE (pulmonary thromboembolism) (HCC) 12/28/2019   Thank you,  Havery Moros, CCC-SLP [phone]  [facility] 06/07/2020, 12:47 PM  [facility] [address] [place], [place], [address] Phone: [phone]   Fax:  [phone]  Name: Gary Sellers MRN: [id number] Date of Birth: [birth year]

## 2020-06-07 NOTE — Discharge Instructions (Addendum)
You came to the emergency department today to be evaluated for your generalized weakness.  Your physical exam showed no neurological deficits.  CT scan of your head showed no bleeding or other acute abnormalities.  Chest x-ray showed no signs of infection.  Your lab work was reassuring.  Your urinalysis did not show any signs of infection.  There is no obvious acute cause for your generalized weakness.  Please follow-up with your primary care provider for further management.    Get help right away if you: Develop sudden weakness, especially on one side of your face or body. Have chest pain. Have trouble breathing or shortness of breath. Have problems with your vision. Have trouble talking or swallowing. Have trouble standing or walking. Are light-headed or lose consciousness.

## 2020-06-08 NOTE — ED Provider Notes (Signed)
[facility] EMERGENCY DEPARTMENT Provider Note   CSN: [id number] Arrival date & time: 06/07/20  1524     History Chief Complaint  Patient presents with  . Weakness    Gary Sellers is a 86 y.o. male with a history of Parkinson's disease, thyroid disease, dementia, and GERD.  Patient's wife is at bedside she denies that patient has dementia.  Patient is found to be alert and oriented to person, place,time, and event.    Patient's wife reports that patient has had generalized weakness and been more unsteady on his feet.  She reports that the symptoms began yesterday and have been constant.   Weakness has not changed.  No alleviating or aggravating factors. She reports that he reports that patient walks with a rolling walker at home without difficulty.  She also reports that patient suffered a fall last week.  She denies that patient hit his head or have any loss of consciousness.  Patient endorses feeling unsteady on his feet.  Patient denies any other complaints.  Patient has been vaccinated for covid 19 x2 and received booster.    Per chart review patient underwent swallow study earlier today.      Past Medical History:  Diagnosis Date  . Dementia [facility])    wife denies history of dementia.  Marland Kitchen GERD (gastroesophageal reflux disease)   . Parkinson disease (HCC)   . Thyroid disease     Patient Active Problem List   Diagnosis Date Noted  . PE (pulmonary thromboembolism) (HCC) 12/28/2019    Past Surgical History:  Procedure Laterality Date  . DEEP BRAIN STIMULATOR PLACEMENT    . PACEMAKER PLACEMENT         No family history on file.  Social History   Tobacco Use  . Smoking status: Never Smoker  . Smokeless tobacco: Never Used  Vaping Use  . Vaping Use: Never used  Substance Use Topics  . Alcohol use: Never  . Drug use: Never    Home Medications Prior to Admission medications   Medication Sig Start Date End Date Taking? Authorizing Provider  apixaban  (ELIQUIS) 5 MG TABS tablet Take 1 tablet (5 mg total) by mouth 2 (two) times daily. 02/07/20 04/07/20 Yes Doreatha Massed, MD  ascorbic acid (VITAMIN C) 500 MG tablet Take 1 tablet by mouth 2 (two) times daily.   Yes [provider]  carbidopa-levodopa (SINEMET IR) 25-100 MG tablet Take 1 tablet by mouth every 3 (three) hours. 12/09/19  Yes [provider]  Coenzyme Q10 10 MG capsule Take 1 tablet by mouth daily.    Yes [provider]  DHEA 25 MG CAPS Take 1 tablet by mouth daily.   Yes [provider]  dorzolamide-timolol (COSOPT) 22.3-6.8 MG/ML ophthalmic solution Place 1 drop into both eyes 2 (two) times daily.  11/25/12  Yes [provider]  Garlic 100 MG TABS Take 1 tablet by mouth daily.    Yes [provider]  GLUTATHIONE PO Take 1 tablet by mouth daily.    Yes [provider]  KRILL OIL PO Take 1 tablet by mouth daily.   Yes [provider]  Multiple Vitamins-Minerals (MULTIVITAMIN WITH MINERALS) tablet Take 1 tablet by mouth daily.   Yes [provider]  omeprazole (PRILOSEC) 40 MG capsule Take 40 mg by mouth daily. 01/26/13  Yes [provider]  polyethylene glycol (MIRALAX / GLYCOLAX) packet Take 17 g by mouth daily. 01/07/18  Yes Mesner, Barbara Cower, MD  tamsulosin (FLOMAX) 0.4 MG CAPS  capsule Take 0.4 mg by mouth 3 (three) times daily. Take two capsule three times daily   Yes [provider]  thyroid (ARMOUR) 30 MG tablet Take 1 tablet by mouth daily.    Yes [provider]  TRAVATAN Z 0.004 % SOLN ophthalmic solution Place 1 drop into both eyes at bedtime.  11/25/12  Yes [provider]  enoxaparin (LOVENOX) 100 MG/ML injection Inject 1 mL (100 mg total) into the skin daily. 02/06/20   Doreatha Massed, MD    Allergies    Patient has no known allergies.  Review of Systems   Review of Systems  Constitutional: Negative for chills and fever.  HENT: Negative for  congestion, rhinorrhea and sore throat.   Eyes: Negative for visual disturbance.  Respiratory: Negative for shortness of breath.   Cardiovascular: Negative for chest pain.  Gastrointestinal: Negative for abdominal pain, nausea and vomiting.  Genitourinary: Negative for dysuria and frequency.  Musculoskeletal: Negative for back pain and neck pain.  Skin: Negative for color change and rash.  Neurological: Positive for weakness. Negative for dizziness, syncope, facial asymmetry, speech difficulty, light-headedness, numbness and headaches.  Psychiatric/Behavioral: Negative for confusion.    Physical Exam Updated Vital Signs BP (!) 148/79   Pulse 68   Temp (!) 97.5 F (36.4 C) (Oral)   Resp 17   Ht  (1.803 m)   Wt 72.6 kg   SpO2 97%   BMI 22.32 kg/m   Physical Exam Vitals and nursing note reviewed.  Constitutional:      General: He is not in acute distress.    Appearance: He is not ill-appearing, toxic-appearing or diaphoretic.  HENT:     Head: Normocephalic and atraumatic.  Eyes:     General: No scleral icterus.       Right eye: No discharge.        Left eye: No discharge.     Extraocular Movements: Extraocular movements intact.     Pupils: Pupils are equal, round, and reactive to light.  Cardiovascular:     Rate and Rhythm: Normal rate.  Pulmonary:     Effort: Pulmonary effort is normal. No tachypnea, bradypnea or respiratory distress.     Breath sounds: No stridor. No decreased breath sounds, wheezing, rhonchi or rales.  Chest:     Comments: stimulator noted to left chest   Abdominal:     General: Abdomen is flat. There is no distension. There are no signs of injury.     Palpations: Abdomen is soft. There is no mass or pulsatile mass.     Tenderness: There is no abdominal tenderness. There is no guarding or rebound.     Hernia: There is no hernia in the umbilical area or ventral area.  Musculoskeletal:     Cervical back: Normal range of motion and neck supple.  No swelling, edema, deformity, erythema, signs of trauma, lacerations, rigidity, torticollis, tenderness or bony tenderness. No pain with movement, spinous process tenderness or muscular tenderness. Normal range of motion.     Thoracic back: No deformity, tenderness or bony tenderness.     Lumbar back: No deformity, tenderness or bony tenderness.     Right lower leg: No swelling, deformity, lacerations, tenderness or bony tenderness. No edema.     Left lower leg: No swelling, deformity, lacerations, tenderness or bony tenderness. No edema.  Skin:    General: Skin is warm and dry.  Neurological:     General: No focal deficit present.     Mental  Status: He is alert and oriented to person, place, and time.     GCS: GCS eye subscore is 4. GCS verbal subscore is 5. GCS motor subscore is 6.     Cranial Nerves: No cranial nerve deficit or facial asymmetry.     Sensory: Sensation is intact.     Motor: Pronator drift present. No weakness, tremor or seizure activity.     Coordination: Finger-Nose-Finger Test normal.     Gait: Gait is intact.     Comments: CN II-XII intact, equal grip strength, +5 strength to bilateral upper and lower extremities   Patient was able to stand and take a few steps with assistance; patient was unsteady on his feet  Psychiatric:        Behavior: Behavior is cooperative.     ED Results / Procedures / Treatments   Labs (all labs ordered are listed, but only abnormal results are displayed) Labs Reviewed  URINALYSIS, ROUTINE W REFLEX MICROSCOPIC - Abnormal; Notable for the following components:      Result Value   Ketones, ur 5 (*)    All other components within normal limits  COMPREHENSIVE METABOLIC PANEL - Abnormal; Notable for the following components:   Sodium 134 (*)    Glucose, Bld 101 (*)    BUN 31 (*)    Calcium 8.7 (*)    Total Protein 6.0 (*)    All other components within normal limits  CBC WITH DIFFERENTIAL/PLATELET    EKG EKG  Interpretation  Date/Time:  Thursday June 07 2020 16:12:03 EST Ventricular Rate:  67 PR Interval:    QRS Duration: 76 QT Interval:  412 QTC Calculation: 435 R Axis:   74 Text Interpretation: Accelerated Junctional rhythm Junctional ST depression, probably normal Abnormal ECG Interpretation limited secondary to artifact ? pacer artifact Confirmed by Vanetta Mulders [phone]) on 06/07/2020 4:56:25 PM   Radiology CT Head Wo Contrast  Result Date: 06/07/2020 CLINICAL DATA:  Head trauma, minor (Age >= 65y) Weakness.  Fall last week and, did not strike head. EXAM: CT HEAD WITHOUT CONTRAST TECHNIQUE: Contiguous axial images were obtained from the base of the skull through the vertex without intravenous contrast. COMPARISON:  None. FINDINGS: Brain: Bilateral deep brain stimulators in place, tips in the low basal ganglia. Mild associated streak artifact. No hemorrhage. No evidence of acute ischemia. Age related atrophy with moderate chronic small vessel ischemia. No subdural or extra-axial collection. Vascular: Atherosclerosis of skullbase vasculature without hyperdense vessel or abnormal calcification. Skull: No fracture or focal lesion. Sinuses/Orbits: Paranasal sinuses and mastoid air cells are clear. The visualized orbits are unremarkable. Bilateral cataract resection. Other: Deep brain stimulator wires course in the left scalp and posterior upper neck. IMPRESSION: 1. No acute intracranial abnormality. No skull fracture. 2. Age related atrophy and chronic small vessel ischemia. 3. Bilateral deep brain stimulators in place. Electronically Signed   By: Narda Rutherford M.D.   On: 06/07/2020 19:23   DG OP Swallowing Func-Medicare/Speech Path  Result Date: 06/07/2020 [facility] [address] [place], [place], [address] Phone: [phone]   Fax:  [phone]  Modified Barium Swallow  Patient Details Name: Gary Sellers MRN: [id number] Date of Birth: 01-27-1935 No  data recorded  Encounter Date: 06/07/2020        End of Session - 06/07/20 1232        Visit Number 1    Number of Visits 1    Authorization Type Medicare    SLP Start Time 1136  SLP Stop Time  1202    SLP Time Calculation (min) 26 min    Activity Tolerance Patient tolerated treatment well            Past Medical History: Diagnosis Date . Dementia (HCC)   . Parkinson disease [facility])         Past Surgical History: Procedure Laterality Date . DEEP BRAIN STIMULATOR PLACEMENT     . PACEMAKER PLACEMENT       There were no vitals filed for this visit.        Subjective Assessment - 06/07/20 1224        Subjective "Okay"    Patient is accompained by: Family member    Special Tests MBSS    Currently in Pain? No/denies                General - 06/07/20 1225          General Information   Date of Onset 05/22/20    HPI Ransom Nickson. is a 86 y.o. male who was referred for MBSS by his neurologist, Dr. Georga Hacking [employer] due to dysphagia (however spouse denies). Pt with PD s/p DBS. Briefly, he has had PD for 10+ years and underwent bilateral STN DBS placement in 2009 by Dr. Gladstone Pih at [facility]. Since then he has been following at the [facility] and was most recently seen in May 2019 by Dr. Andria Meuse and Dr. Raford Pitcher. His DBS settings were unchanged but his Sinemet regimen was simplified to Sinemet CR 50/200, 1 tab QID. He had an IPG replacement by Dr Gladstone Pih in Sept 2019 which went well. He reports that he takes Sinemet at 7-11-3-7. He has a history of falls and usually falls backwards. He successfully underwent DBS battery replacement by Dr Mayford Knife in Oct 2021.    Type of Study MBS-Modified Barium Swallow Study    Previous Swallow Assessment None on record    Diet Prior to this Study Regular;Thin liquids    Temperature Spikes Noted No    Respiratory Status Room air    History of Recent Intubation No    Behavior/Cognition Alert;Cooperative;Pleasant mood    Oral Cavity Assessment Within Functional Limits    Oral Care Completed  by SLP No    Oral Cavity - Dentition Adequate natural dentition    Vision Functional for self feeding    Self-Feeding Abilities Able to feed self    Patient Positioning Upright in chair    Baseline Vocal Quality Normal untitled image  mild hypophonia   Volitional Cough Strong    Volitional Swallow Able to elicit    Anatomy Within functional limits    Pharyngeal Secretions Not observed secondary MBS                Oral Preparation/Oral Phase - 06/07/20 1226          Oral Preparation/Oral Phase   Oral Phase Within functional limits      Electrical stimulation - Oral Phase   Was Electrical Stimulation Used No               Pharyngeal Phase - 06/07/20 1226          Pharyngeal Phase   Pharyngeal Phase Impaired      Pharyngeal - Thin   Pharyngeal- Thin Teaspoon Swallow initiation at pyriform sinus    Pharyngeal- Thin Cup Swallow initiation at pyriform sinus;Penetration/Aspiration during swallow;Reduced tongue base retraction;Pharyngeal residue - valleculae;Lateral channel residue;Pharyngeal residue - pyriform untitled image  trace/mild residuals and cleared with dry swallow   Pharyngeal Material does not enter airway;Material enters airway, remains ABOVE vocal cords then ejected out    Pharyngeal- Thin Straw Swallow initiation at pyriform sinus;Pharyngeal residue - valleculae;Pharyngeal residue - pyriform;Lateral channel residue      Pharyngeal - Solids   Pharyngeal- Puree Swallow initiation at vallecula;Within functional limits    Pharyngeal- Regular Within functional limits;Delayed swallow initiation-vallecula    Pharyngeal- Pill Within functional limits      Electrical Stimulation - Pharyngeal Phase   Was Electrical Stimulation Used No               Cricopharyngeal Phase - 06/07/20 1228          Cervical Esophageal Phase   Cervical Esophageal Phase Impaired      Cervical Esophageal Phase - Thin   Thin Teaspoon Prominent cricopharyngeal segment      Cervical Esophageal Phase - Comment   Cervical Esophageal Comment  Notable for prominent CP with small collection of barium immediately following which clears    Other Esophageal Phase Observations WNL during sweep               Plan - 06/07/20 1233        Clinical Impression Statement Pt presents with min oropharyngeal dysphagia in setting of advanced age and Parkinson's disease (Pt with DBS and takes Sinemet) characterized by min lingual weakness with premature spillage of liquids over the base of tongue with swallow trigger at the level of the pyriforms and min decreased tongue base retraction resulting in a single episode of flash penetration of tsp presentation thin barium (no aspiration) and min residuals along base of tongue, valleculae, and lateral channels post inititial swallow which cleared with spontaneous and occasionally cued repeat/dry swallow. Residuals only observed with liquids (tsp, cup, straw) and not visualized with puree or solids. Pt required three sips/swallows to clear the barium tablet over the base of tongue, otherwise WNL. Esophageal sweep completed and notable for a prominent cricopharyngeus with a small collection of barium immediately posterior, but cleared. Recommend regular textures and thin liquids via small bites/sips and remind Pt to swallow 2x for each sip (to clear pharyngeal residuals) and standard aspiration and reflux precautions. Pt initiated a spontaneous secondary/dry swallow ~50% of the time, so will benefit from reminders to "swallow again" especially if using straws, as the residuals occurred in greater amounts with straw sips. Results of this study were reviewed with Pt and spouse and recommendations were provided in written format along with my contact information should they have further questions.    Potential to Achieve Goals Good    Consulted and Agree with Plan of Care Patient;Family member/caregiver    Family Member Consulted Spouse         Patient will benefit from skilled therapeutic intervention in order to improve the  following deficits and impairments:  Dysphagia, oropharyngeal phase         Recommendations/Treatment - 06/07/20 1230          Swallow Evaluation Recommendations   SLP Diet Recommendations Age appropriate regular;Thin    Liquid Administration via Cup;Straw    Medication Administration Whole meds with liquid    Supervision Patient able to self feed    Compensations Slow rate;Small sips/bites;Multiple dry swallows after each bite/sip    Postural Changes Seated upright at 90 degrees;Remain upright for at least 30 minutes after feeds/meals               Prognosis -  06/07/20 1230          Prognosis   Prognosis for Safe Diet Advancement Good    Barriers/Prognosis Comment Unsure if performance varies throughout the day +- when medication wears off      Individuals Consulted   Consulted and Agree with Results and Recommendations Patient;Family member/caregiver    Family Member Consulted Spouse    Report Sent to  Referring physician         Problem List    Patient Active Problem List   Diagnosis Date Noted . PE (pulmonary thromboembolism) (HCC) 12/28/2019  Thank you,  Havery Moros, CCC-SLP [phone]  [facility] 06/07/2020, 12:47 PM  [place] [facility] [address] [place], [place], [address] Phone: [phone]   Fax:  [phone]  Name: Gary Sellers MRN: [id number] Date of Birth: 12-06-1934    CLINICAL DATA:  Dysphagia. Parkinson's disease, has deep brain stimulator EXAM: MODIFIED BARIUM SWALLOW TECHNIQUE: Different consistencies of barium were administered orally to the patient by the Speech Pathologist. Imaging of the pharynx was performed in the lateral projection. The radiologist was present in the fluoroscopy room for this study, providing personal supervision. FLUOROSCOPY TIME:  Fluoroscopy Time:  2 minutes 12 seconds Radiation Exposure Index (if provided by the fluoroscopic device): 9.0 mGy Number of Acquired Spot Images: multiple fluoroscopic screen captures COMPARISON:  None  FINDINGS: Single episode of flash laryngeal penetration of thin barium by cup. No aspiration. No residuals. Applesauce and cracker consistencies unremarkable. Patient swallowed a 12.5 mm diameter barium tablet with thin barium without difficulty. The barium tablet passed to the stomach without obstruction. IMPRESSION: Single episode of flash laryngeal penetration of thin barium. Please refer to the Speech Pathologists report for complete details and recommendations. Electronically Signed   By: Ulyses Southward M.D.   On: 06/07/2020 13:44   DG Chest Portable 1 View  Result Date: 06/07/2020 CLINICAL DATA:  Generalized weakness beginning yesterday. EXAM: PORTABLE CHEST 1 VIEW COMPARISON:  12/28/2019 FINDINGS: Heart and mediastinal shadows are normal except for tortuosity of the aorta. Chronic abnormal interstitial lung markings, unchanged from previous. No sign of active infiltrate, mass, effusion or collapse. Stimulator in place on the left extending into the neck region. IMPRESSION: No active disease. Chronic interstitial lung markings. Electronically Signed   By: Paulina Fusi M.D.   On: 06/07/2020 19:15    Procedures Procedures   Medications Ordered in ED Medications - No data to display  ED Course  I have reviewed the triage vital signs and the nursing notes.  Pertinent labs & imaging results that were available during my care of the patient were reviewed by me and considered in my medical decision making (see chart for details).    MDM Rules/Calculators/A&P                          Alert and orientated 86 year old male in no acute distress, nontoxic-appearing.  Presents with a chief complaint of unsteady gait and generalized weakness x2 days.  Patient has a history of Parkinson's and dementia.  Patient's wife reports that patient uses a rolling walker.  Patient's wife also reports that patient suffered a fall approximately 1 week prior; she denies any head injury or loss of consciousness.  Patient  is noted to be on Eliquis.  Patient found to be alert to person, place, time and event.  No neurological deficit noted on physical exam.  Patient was able to stand with assistance and take a  few steps.    Will obtain noncontrast head CT, chest x-ray, EKG, CMP, CBC, UA to assess for possible cause for patient's generalized weakness.  Noncontrast head CT showed no acute intracranial abnormality.  Chest x-ray showed no acute cardiopulmonary disease.  CBC was unremarkable.  UA showed no signs of infection.  CMP showed sodium slightly lower at 134, glucose at 101,Calcium slightly lower at 8.7.  BUN noted to be elevated at 31.  This may represent patient's baseline as 5 months prior BUN ranged from 27-34.  No acute intracranial cause for patient's symptoms.  No acute infectious cause for patient's symptoms.  Patient's generalized weakness and increased unsteady gait may represent progression of his Parkinson's disease state.  Will have patient follow-up with his primary care provider.  Will have patient follow-up with neurologist as needed.  Discussed results, findings, treatment and follow up. Patient and his wife advised of return precautions. Patient and his wifeverbalized understanding and agreed with plan.  Patient was discussed with and evaluated by Dr. Deretha Emory.    Final Clinical Impression(s) / ED Diagnoses Final diagnoses:  Generalized weakness    Rx / DC Orders ED Discharge Orders    None       Haskel Schroeder, PA-C 06/08/20 0981    Vanetta Mulders, MD 06/27/20 [phone]

## 2020-07-31 ENCOUNTER — Inpatient Hospital Stay (HOSPITAL_COMMUNITY): Payer: Medicare Other | Attending: Hematology

## 2020-07-31 ENCOUNTER — Other Ambulatory Visit: Payer: Self-pay

## 2020-07-31 DIAGNOSIS — Z7901 Long term (current) use of anticoagulants: Secondary | ICD-10-CM | POA: Insufficient documentation

## 2020-07-31 DIAGNOSIS — I2699 Other pulmonary embolism without acute cor pulmonale: Secondary | ICD-10-CM

## 2020-07-31 DIAGNOSIS — Z86711 Personal history of pulmonary embolism: Secondary | ICD-10-CM | POA: Insufficient documentation

## 2020-07-31 DIAGNOSIS — G20 Parkinson's disease: Secondary | ICD-10-CM | POA: Diagnosis not present

## 2020-07-31 DIAGNOSIS — K219 Gastro-esophageal reflux disease without esophagitis: Secondary | ICD-10-CM | POA: Insufficient documentation

## 2020-07-31 DIAGNOSIS — F0280 Dementia in other diseases classified elsewhere without behavioral disturbance: Secondary | ICD-10-CM | POA: Insufficient documentation

## 2020-07-31 DIAGNOSIS — Z86718 Personal history of other venous thrombosis and embolism: Secondary | ICD-10-CM | POA: Diagnosis not present

## 2020-07-31 DIAGNOSIS — Z79899 Other long term (current) drug therapy: Secondary | ICD-10-CM | POA: Insufficient documentation

## 2020-07-31 LAB — D-DIMER, QUANTITATIVE: D-Dimer, Quant: <0.27 ug/mL-FEU (ref 0.00–0.50)

## 2020-08-03 ENCOUNTER — Other Ambulatory Visit (HOSPITAL_COMMUNITY): Payer: Self-pay | Admitting: Hematology

## 2020-08-07 ENCOUNTER — Other Ambulatory Visit: Payer: Self-pay

## 2020-08-07 ENCOUNTER — Inpatient Hospital Stay (HOSPITAL_BASED_OUTPATIENT_CLINIC_OR_DEPARTMENT_OTHER): Payer: Medicare Other | Admitting: Hematology

## 2020-08-07 ENCOUNTER — Encounter (HOSPITAL_COMMUNITY): Payer: Self-pay

## 2020-08-07 VITALS — BP 99/56 | HR 68 | Temp 97.8°F | Resp 17 | Ht 71.0 in | Wt 160.1 lb

## 2020-08-07 DIAGNOSIS — Z86711 Personal history of pulmonary embolism: Secondary | ICD-10-CM | POA: Diagnosis not present

## 2020-08-07 DIAGNOSIS — I2699 Other pulmonary embolism without acute cor pulmonale: Secondary | ICD-10-CM

## 2020-08-07 NOTE — Progress Notes (Signed)
[facility] [address] [place] [place], [place] [address]   CLINIC:  Medical Oncology/Hematology  PCP:  System, Provider Not In None  None  REASON FOR VISIT:  Follow-up for PE  PRIOR THERAPY: Eliquis for 6 months in 01/2013  CURRENT THERAPY: Eliquis 5 mg BID  INTERVAL HISTORY:  Mr. Micahel Sellers, a 86 y.o. male, returns for routine follow-up for his PE. Kiandre was last seen on 02/06/2020.  Today he is accompanied by his wife and he reports feeling okay. He is at home now. He had the deep brain stimulator battery changed. He also had skin cancer surgeries on 04/04 and 04/18 and had some more oozing from the skin incision, but no excess bleeding. He is taking Eliquis BID and denies having SOB, dyspnea, leg swelling or pain, CP, nosebleeds, hematochezia or hematuria. He moves around a lot in the house with his rollator and has not fallen recently.   REVIEW OF SYSTEMS:  Review of Systems  Constitutional: Positive for fatigue (75%). Negative for appetite change.  HENT:   Negative for nosebleeds.   Respiratory: Negative for shortness of breath.   Cardiovascular: Negative for chest pain and leg swelling.  Gastrointestinal: Negative for blood in stool.  Genitourinary: Negative for hematuria.   Musculoskeletal: Negative for myalgias.  All other systems reviewed and are negative.   PAST MEDICAL/SURGICAL HISTORY:  Past Medical History:  Diagnosis Date  . Dementia [facility])    wife denies history of dementia.  Marland Kitchen GERD (gastroesophageal reflux disease)   . Parkinson disease (HCC)   . Thyroid disease    Past Surgical History:  Procedure Laterality Date  . DEEP BRAIN STIMULATOR PLACEMENT    . PACEMAKER PLACEMENT      SOCIAL HISTORY:  Social History   Socioeconomic History  . Marital status: Married    Spouse name: Not on file  . Number of children: Not on file  . Years of education: Not on file  . Highest education level: Not on file  Occupational History  . Not  on file  Tobacco Use  . Smoking status: Never Smoker  . Smokeless tobacco: Never Used  Vaping Use  . Vaping Use: Never used  Substance and Sexual Activity  . Alcohol use: Never  . Drug use: Never  . Sexual activity: Never  Other Topics Concern  . Not on file  Social History Narrative  . Not on file   Social Determinants of Health   Financial Resource Strain: Not on file  Food Insecurity: Not on file  Transportation Needs: Not on file  Physical Activity: Not on file  Stress: Not on file  Social Connections: Not on file  Intimate Partner Violence: Not on file    FAMILY HISTORY:  No family history on file.  CURRENT MEDICATIONS:  Current Outpatient Medications  Medication Sig Dispense Refill  . ascorbic acid (VITAMIN C) 500 MG tablet Take 1 tablet by mouth 2 (two) times daily.    . carbidopa-levodopa (SINEMET IR) 25-100 MG tablet Take 1 tablet by mouth every 3 (three) hours.    . Coenzyme Q10 10 MG capsule Take 1 tablet by mouth daily.     Marland Kitchen DHEA 25 MG CAPS Take 1 tablet by mouth daily.    . dorzolamide-timolol (COSOPT) 22.3-6.8 MG/ML ophthalmic solution Place 1 drop into both eyes 2 (two) times daily.     Marland Kitchen ELIQUIS 5 MG TABS tablet Take 1 tablet by mouth twice daily 120 tablet 0  . enoxaparin (LOVENOX) 100  MG/ML injection Inject 1 mL (100 mg total) into the skin daily. 3 mL 0  . Garlic 100 MG TABS Take 1 tablet by mouth daily.     Marland Kitchen GLUTATHIONE PO Take 1 tablet by mouth daily.     Marland Kitchen KRILL OIL PO Take 1 tablet by mouth daily.    . Multiple Vitamins-Minerals (MULTIVITAMIN WITH MINERALS) tablet Take 1 tablet by mouth daily.    Marland Kitchen omeprazole (PRILOSEC) 40 MG capsule Take 40 mg by mouth daily.    . polyethylene glycol (MIRALAX / GLYCOLAX) packet Take 17 g by mouth daily. 14 each 0  . tamsulosin (FLOMAX) 0.4 MG CAPS capsule Take 0.4 mg by mouth 3 (three) times daily. Take two capsule three times daily    . thyroid (ARMOUR) 30 MG tablet Take 1 tablet by mouth daily.     . TRAVATAN  Z 0.004 % SOLN ophthalmic solution Place 1 drop into both eyes at bedtime.      No current facility-administered medications for this visit.    ALLERGIES:  No Known Allergies  PHYSICAL EXAM:  Performance status (ECOG): 1 - Symptomatic but completely ambulatory  Vitals:   08/07/20 1139  BP: (!) 99/56  Pulse: 68  Resp: 17  Temp: 97.8 F (36.6 C)  SpO2: 96%   Wt Readings from Last 3 Encounters:  08/07/20 160 lb 2 oz (72.6 kg)  06/07/20 160 lb (72.6 kg)  02/06/20 156 lb 11.2 oz (71.1 kg)   Physical Exam Vitals reviewed.  Constitutional:      Appearance: Normal appearance.     Comments: Using rollator  Cardiovascular:     Rate and Rhythm: Normal rate and regular rhythm.     Pulses: Normal pulses.     Heart sounds: Normal heart sounds.  Pulmonary:     Effort: Pulmonary effort is normal.     Breath sounds: Normal breath sounds.  Musculoskeletal:     Right lower leg: No edema.     Left lower leg: No edema.  Neurological:     General: No focal deficit present.     Mental Status: He is alert and oriented to person, place, and time.  Psychiatric:        Mood and Affect: Mood normal.        Behavior: Behavior normal.     LABORATORY DATA:  I have reviewed the labs as listed.  CBC Latest Ref Rng & Units 06/07/2020 01/04/2020 01/03/2020  WBC 4.0 - 10.5 K/uL 7.1 12.2(H) 8.0  Hemoglobin 13.0 - 17.0 g/dL 60.7 12.9(L) 12.8(L)  Hematocrit 39.0 - 52.0 % 42.6 40.6 39.7  Platelets 150 - 400 K/uL 230 315 284   CMP Latest Ref Rng & Units 06/07/2020 01/04/2020 01/03/2020  Glucose 70 - 99 mg/dL 371(G) 626(R) 485(I)  BUN 8 - 23 mg/dL 62(V) 03(J) 00(X)  Creatinine 0.61 - 1.24 mg/dL 3.81 8.29 9.37  Sodium 135 - 145 mmol/L 134(L) 136 138  Potassium 3.5 - 5.1 mmol/L 4.2 4.1 4.1  Chloride 98 - 111 mmol/L 104 102 104  CO2 22 - 32 mmol/L 24 26 26   Calcium 8.9 - 10.3 mg/dL ) 9.3 9.3  Total Protein 6.5 - 8.1 g/dL 6.0(L) - -  Total Bilirubin 0.3 - 1.2 mg/dL 0.6 - -  Alkaline Phos 38 -  126 U/L 63 - -  AST 15 - 41 U/L 19 - -  ALT 0 - 44 U/L 6 - -      Component Value Date/Time   RBC 4.47 06/07/2020  1958   MCV 95.3 06/07/2020 1958   MCH 30.6 06/07/2020 1958   MCHC 32.2 06/07/2020 1958   RDW 13.2 06/07/2020 1958   LYMPHSABS 1.4 06/07/2020 1958   MONOABS 0.7 06/07/2020 1958   EOSABS 0.4 06/07/2020 1958   BASOSABS 0.1 06/07/2020 1958    DIAGNOSTIC IMAGING:  I have independently reviewed the scans and discussed with the patient. No results found.   ASSESSMENT:  1. Unprovoked pulmonary embolism: -CT angio on 12/28/2019 shows acute bilateral pulmonary emboli with CT evidence of right heart strain. -Patient was not severely immobile although his mobility is slightly limited by Parkinson's disease. -He is currently on Eliquis. He is currently a resident at [facility] nursing home in [place]. -He had DVT/PE in the leg few years ago (October 2014) and was treated with 6 months of anticoagulation. -Reviewed labs from recent hospitalization from 01/01/2020. Factor V Leiden, prothrombin gene mutation, lupus anticoagulant, beta-2 glycoprotein 1 antibody, anticardiolipin antibody were negative. Protein C&S levels were normal. Antithrombin III was normal. -As he had unprovoked to weakly provoked pulmonary embolism with right heart strain, I have recommended indefinite anticoagulation at this time. He also had DVT/PE in 2014, precipitating circumstances unknown. Patient is a poor historian.  2. Parkinson's disease: -He has deep brain stimulator(DBS)placed at [facility] in2009. He has dysarthria and hypophonia. -Battery was changed on 02/14/2020.   PLAN:  1. Unprovoked pulmonary embolism: -He had dietary change for deep brain stimulator done in October 2021 without any major events. - He is tolerating Eliquis very well.  No bleeding issues. - No falls from parkinsonism at this time. - At this time benefits of continuing anticoagulation outweigh the risks.   D-dimer is negative. - Will reevaluate risk-benefit ratio in 6 months.  Orders placed this encounter:  Orders Placed This Encounter  Procedures  . D-dimer, quantitative     Doreatha Massed, MD [facility] [phone]   I, Drue Second, am acting as a scribe for Dr. Payton Mccallum.  I, Doreatha Massed MD, have reviewed the above documentation for accuracy and completeness, and I agree with the above.

## 2020-08-07 NOTE — Patient Instructions (Signed)
[facility] at [facility] Discharge Instructions  You were seen today by Dr. Ellin Saba. He went over your recent results. Continue taking Eliquis twice daily. If you develop excess bleeding or falling more often, please call the office. Dr. Ellin Saba will see you back in 6 months for labs and follow up.   Thank you for choosing [facility] at [facility] to provide your oncology and hematology care.  To afford each patient quality time with our provider, please arrive at least 15 minutes before your scheduled appointment time.   If you have a lab appointment with the Cancer Center please come in thru the Main Entrance and check in at the main information desk  You need to re-schedule your appointment should you arrive 10 or more minutes late.  We strive to give you quality time with our providers, and arriving late affects you and other patients whose appointments are after yours.  Also, if you no show three or more times for appointments you may be dismissed from the clinic at the providers discretion.     Again, thank you for choosing [facility].  Our hope is that these requests will decrease the amount of time that you wait before being seen by our physicians.       _____________________________________________________________  Should you have questions after your visit to [facility], please contact our office at [phone] between the hours of 8:00 a.m. and 4:30 p.m.  Voicemails left after 4:00 p.m. will not be returned until the following business day.  For prescription refill requests, have your pharmacy contact our office and allow 72 hours.    Cancer Center Support Programs:   > Cancer Support Group  2nd Tuesday of the month 1pm-2pm, Journey Room

## 2020-08-08 NOTE — Telephone Encounter (Signed)
Pls advise.  Looks to me like the answer would be no at this point.

## 2020-09-17 ENCOUNTER — Encounter (HOSPITAL_COMMUNITY): Payer: Self-pay | Admitting: *Deleted

## 2020-09-17 ENCOUNTER — Other Ambulatory Visit: Payer: Self-pay

## 2020-09-17 ENCOUNTER — Emergency Department (HOSPITAL_COMMUNITY): Payer: Medicare Other

## 2020-09-17 ENCOUNTER — Emergency Department (HOSPITAL_COMMUNITY)
Admission: EM | Admit: 2020-09-17 | Discharge: 2020-09-17 | Disposition: A | Discharge status: Home / Self Care | Payer: Medicare Other | Attending: Emergency Medicine | Admitting: Emergency Medicine

## 2020-09-17 DIAGNOSIS — R519 Headache, unspecified: Secondary | ICD-10-CM | POA: Diagnosis not present

## 2020-09-17 DIAGNOSIS — F0390 Unspecified dementia without behavioral disturbance: Secondary | ICD-10-CM | POA: Diagnosis not present

## 2020-09-17 DIAGNOSIS — W19XXXA Unspecified fall, initial encounter: Secondary | ICD-10-CM | POA: Insufficient documentation

## 2020-09-17 DIAGNOSIS — G20 Parkinson's disease: Secondary | ICD-10-CM | POA: Insufficient documentation

## 2020-09-17 DIAGNOSIS — Z7901 Long term (current) use of anticoagulants: Secondary | ICD-10-CM | POA: Insufficient documentation

## 2020-09-17 NOTE — Discharge Instructions (Addendum)
Exam and imaging are reassuring.  Please continue with all home medication as prescribed.  Please always use your walker when moving around as this will help decrease risk of falls.  Please follow-up with your PCP as needed.  If you have another fall you should come back to the emergency department as you are on a blood thinner and you have a higher chance of having a internal bleed. if you develop headaches, change in vision, numbness or weakness in your extremities, chest pain, shortness of breath, severe abdominal pain please come back to the emergency department

## 2020-09-17 NOTE — ED Triage Notes (Signed)
Here to make sure his head is ok after a fall

## 2020-09-17 NOTE — ED Provider Notes (Signed)
[facility] EMERGENCY DEPARTMENT Provider Note   CSN: [id number] Arrival date & time: 09/17/20  1230     History No chief complaint on file.   Gary Sellers is a 86 y.o. male.  HPI   Patient with significant medical history of dementia, Parkinson's disease, thyroids, unprovoked PE currently on anticoagulant presents to the emergency department seeking evaluation after a fall.  Patient endorses that he had a mechanical fall 1 week ago, states he lost his balance falling onto the ground and hitting his head on the handle of his walker. He  does not think he lost consciousness and had no complaints at that time.  He states today he woke up and did not feel right, states he has a slight headache, but has no other complaints, he denies change in vision, paresthesias or weakness in the upper or lower extremities, denies neck or back pain, chest pain or shortness of breath.  Patient's wife was at bedside was able to validate story.  She states he has been acting his normal self but does state that he told her that his head felt weird.  Patient denies fevers, chills, chest pain, shortness of breath, abdominal pain, nausea, vomit, diarrhea.  Past Medical History:  Diagnosis Date  . Dementia [facility])    wife denies history of dementia.  Marland Kitchen GERD (gastroesophageal reflux disease)   . Parkinson disease (HCC)   . Thyroid disease     Patient Active Problem List   Diagnosis Date Noted  . PE (pulmonary thromboembolism) (HCC) 12/28/2019    Past Surgical History:  Procedure Laterality Date  . DEEP BRAIN STIMULATOR PLACEMENT    . PACEMAKER PLACEMENT         No family history on file.  Social History   Tobacco Use  . Smoking status: Never Smoker  . Smokeless tobacco: Never Used  Vaping Use  . Vaping Use: Never used  Substance Use Topics  . Alcohol use: Never  . Drug use: Never    Home Medications Prior to Admission medications   Medication Sig Start Date End Date Taking? Authorizing  Provider  ascorbic acid (VITAMIN C) 500 MG tablet Take 1 tablet by mouth 2 (two) times daily.    [provider]  carbidopa-levodopa (SINEMET IR) 25-100 MG tablet Take 1 tablet by mouth every 3 (three) hours. 12/09/19   [provider]  Coenzyme Q10 10 MG capsule Take 1 tablet by mouth daily.     [provider]  DHEA 25 MG CAPS Take 1 tablet by mouth daily.    [provider]  dorzolamide-timolol (COSOPT) 22.3-6.8 MG/ML ophthalmic solution Place 1 drop into both eyes 2 (two) times daily.  11/25/12   [provider]  ELIQUIS 5 MG TABS tablet Take 1 tablet by mouth twice daily 08/03/20   Doreatha Massed, MD  enoxaparin (LOVENOX) 100 MG/ML injection Inject 1 mL (100 mg total) into the skin daily. 02/06/20   Doreatha Massed, MD  Garlic 100 MG TABS Take 1 tablet by mouth daily.     [provider]  GLUTATHIONE PO Take 1 tablet by mouth daily.     [provider]  KRILL OIL PO Take 1 tablet by mouth daily.    [provider]  Multiple Vitamins-Minerals (MULTIVITAMIN WITH MINERALS) tablet Take 1 tablet by mouth daily.    [provider]  omeprazole (PRILOSEC) 40 MG capsule Take 40 mg by mouth daily. 01/26/13   [provider]  polyethylene glycol (MIRALAX / Ethelene Hal)  packet Take 17 g by mouth daily. 01/07/18   Mesner, Barbara Cower, MD  tamsulosin (FLOMAX) 0.4 MG CAPS capsule Take 0.4 mg by mouth 3 (three) times daily. Take two capsule three times daily    [provider]  thyroid (ARMOUR) 30 MG tablet Take 1 tablet by mouth daily.     [provider]  TRAVATAN Z 0.004 % SOLN ophthalmic solution Place 1 drop into both eyes at bedtime.  11/25/12   [provider]    Allergies    Patient has no known allergies.  Review of Systems   Review of Systems  Constitutional: Negative for chills and fever.  HENT: Negative for congestion.   Respiratory: Negative for shortness of breath.    Cardiovascular: Negative for chest pain.  Gastrointestinal: Negative for abdominal pain.  Genitourinary: Negative for enuresis.  Musculoskeletal: Negative for back pain.  Skin: Negative for rash.  Neurological: Positive for headaches. Negative for dizziness.  Hematological: Does not bruise/bleed easily.    Physical Exam Updated Vital Signs BP (!) 150/80 (BP Location: Right Arm)   Pulse 63   Temp (!) 97.5 F (36.4 C) (Oral)   Resp 18   Ht 6' (1.829 m)   Wt 68 kg   SpO2 97%   BMI 20.34 kg/m   Physical Exam Vitals and nursing note reviewed.  Constitutional:      General: He is not in acute distress.    Appearance: He is not ill-appearing.  HENT:     Head: Normocephalic and atraumatic.     Comments: Head was palpated it was nontender to palpation, no deformities present, no battle sign or raccoon eyes present    Nose: No congestion.  Eyes:     Extraocular Movements: Extraocular movements intact.     Conjunctiva/sclera: Conjunctivae normal.     Pupils: Pupils are equal, round, and reactive to light.  Cardiovascular:     Rate and Rhythm: Normal rate and regular rhythm.     Pulses: Normal pulses.     Heart sounds: No murmur heard. No friction rub. No gallop.   Pulmonary:     Effort: No respiratory distress.     Breath sounds: No wheezing, rhonchi or rales.  Abdominal:     Palpations: Abdomen is soft.     Tenderness: There is no abdominal tenderness.  Musculoskeletal:     Right lower leg: No edema.     Left lower leg: No edema.     Comments: Chest was palpated nontender to palpation.  Spine was palpated nontender to palpation, no step-off or deformities present.  Skin:    General: Skin is warm and dry.  Neurological:     Mental Status: He is alert.     GCS: GCS eye subscore is 4. GCS verbal subscore is 5. GCS motor subscore is 6.     Motor: No weakness.     Coordination: Romberg sign negative. Finger-Nose-Finger Test normal.     Comments: Cranial nerves II through  XII are grossly intact  Psychiatric:        Mood and Affect: Mood normal.     ED Results / Procedures / Treatments   Labs (all labs ordered are listed, but only abnormal results are displayed) Labs Reviewed - No data to display  EKG EKG Interpretation  Date/Time:  Monday Sep 17 2020 18:07:19 EDT Ventricular Rate:  62 PR Interval:  208 QRS Duration: 82 QT Interval:  434 QTC Calculation: 440 R Axis:   74 Text Interpretation: Normal sinus  rhythm Minimal voltage criteria for LVH, may be normal variant ( Sokolow-Lyon ) Junctional ST depression, probably normal Borderline ECG No significant change since NO SIGNIFICANT CHANGE SINCE LAST TRACING YESTERDAY Confirmed by Meridee Score [phone]) on 09/17/2020 6:23:41 PM   Radiology DG Chest 1 View  Result Date: 09/17/2020 CLINICAL DATA:  Recent fall with chest pain, initial encounter EXAM: CHEST  1 VIEW COMPARISON:  06/07/2020 FINDINGS: Cardiac shadow is stable. Aortic calcifications are noted. Stimulator pack is noted on the left. No focal infiltrate or effusion is seen. No acute bony abnormality is noted. IMPRESSION: No acute abnormality noted. Electronically Signed   By: Alcide Clever M.D.   On: 09/17/2020 17:57   CT Head Wo Contrast  Result Date: 09/17/2020 CLINICAL DATA:  86 year old male with head trauma. EXAM: CT HEAD WITHOUT CONTRAST TECHNIQUE: Contiguous axial images were obtained from the base of the skull through the vertex without intravenous contrast. COMPARISON:  Head CT dated 06/07/2020. FINDINGS: Brain: Mild age-related atrophy and chronic microvascular ischemic changes. Bifrontal deep brain stimulator is noted. There is no acute intracranial hemorrhage. No mass effect or midline shift no extra-axial fluid collection. Vascular: No hyperdense vessel or unexpected calcification. Skull: No acute calvarial pathology. Bifrontal burr hole. Sinuses/Orbits: Mild mucoperiosteal thickening of paranasal sinuses. No air-fluid level. The mastoid  air cells are clear. Other: None IMPRESSION: 1. No acute intracranial pathology. 2. Mild age-related atrophy and chronic microvascular ischemic changes. 3. Bifrontal deep brain stimulator. Electronically Signed   By: Elgie Collard M.D.   On: 09/17/2020 17:58    Procedures Procedures   Medications Ordered in ED Medications - No data to display  ED Course  I have reviewed the triage vital signs and the nursing notes.  Pertinent labs & imaging results that were available during my care of the patient were reviewed by me and considered in my medical decision making (see chart for details).  Clinical Course as of 09/17/20 2105  Mon Sep 17, 2020  [age] Male with Parkinson's here after mechanical fall 1 week ago.  He did strike his head at that time and he is on anticoagulation.  It sounds like he felt some pressure in his head today so wanted to get checked out.  CT does not show any acute findings.  Recommended close follow-up with PCP.  [MB]    Clinical Course User Index [MB] Terrilee Files, MD   MDM Rules/Calculators/A&P                         Initial impression-patient presents after mechanical fall.  He is alert, does not appear to be in acute distress, vital signs reassuring.  Will obtain CT head, chest x-ray EKG.  Work-up-CT head negative for acute findings.  Chest x-ray negative for acute findings.  EKG sinus without signs of ischemia  Reassessment-patient was updated on imaging, patient and wife are agreeable for discharge at this time.  Rule out-low suspicion for intracranial head bleed or CVA as there is no neurodeficits present mag exam, CT head is negative for acute findings.  Low suspicion for systemic infection and/or meningitis as vital signs are reassuring, patient is nontoxic-appearing, no meningeal signs on my exam.  Low suspicion for spinal cord abnormality or spinal fracture as spine was palpated nontender to palpation, no step-off or deformities present, patient  moving all 4 extremities without difficulty.  Low suspicion for rib fracture or pneumothorax as lung sounds are clear bilaterally.  Will defer chest x-ray  at this time.  Low suspicion for intra-abdominal abnormality as stomach is nontender to palpation.  low suspicion for hip fracture as there is no internal or external rotation of the legs, no leg shortening, patient ambulate on his own.  Plan-  1.  Mechanical fall-suspect secondary due to Parkinson's and dementia, will have him follow-up with PCP for further evaluation.  Vital signs have remained stable, no indication for hospital admission.  Patient discussed with attending and they agreed with assessment and plan.  Patient given at home care as well strict return precautions.  Patient verbalized that they understood agreed to said plan.   Final Clinical Impression(s) / ED Diagnoses Final diagnoses:  Fall, initial encounter    Rx / DC Orders ED Discharge Orders    None       Barnie DelFaulkner, Jayln J, PA-C 09/17/20 2105    Terrilee FilesButler, Michael C, MD 09/18/20 [id number]

## 2020-09-17 NOTE — ED Triage Notes (Signed)
Fell last week, hit head on handle of walker, c/o head feeling tight this am

## 2020-09-17 NOTE — ED Notes (Signed)
Patient transported to CT 

## 2020-09-17 NOTE — ED Notes (Signed)
Labs drawn

## 2020-09-17 NOTE — ED Triage Notes (Signed)
Patient is on blood thinner 

## 2020-10-03 ENCOUNTER — Other Ambulatory Visit (HOSPITAL_COMMUNITY): Payer: Self-pay

## 2020-10-04 ENCOUNTER — Other Ambulatory Visit (HOSPITAL_COMMUNITY): Payer: Self-pay | Admitting: *Deleted

## 2020-10-04 MED ORDER — ELIQUIS 5 MG PO TABS: 1.0000 | ORAL_TABLET | Freq: Two times a day (BID) | ORAL | 0 refills | Status: DC

## 2020-10-04 NOTE — Telephone Encounter (Signed)
Rx sent 

## 2020-12-04 ENCOUNTER — Encounter (HOSPITAL_COMMUNITY): Payer: Self-pay

## 2020-12-05 ENCOUNTER — Encounter (HOSPITAL_COMMUNITY): Payer: Self-pay | Admitting: *Deleted

## 2021-02-06 ENCOUNTER — Other Ambulatory Visit: Payer: Self-pay

## 2021-02-06 ENCOUNTER — Inpatient Hospital Stay (HOSPITAL_COMMUNITY): Payer: Medicare Other | Attending: Hematology

## 2021-02-06 DIAGNOSIS — Z86711 Personal history of pulmonary embolism: Secondary | ICD-10-CM | POA: Insufficient documentation

## 2021-02-06 DIAGNOSIS — G20 Parkinson's disease: Secondary | ICD-10-CM | POA: Insufficient documentation

## 2021-02-06 DIAGNOSIS — Z7901 Long term (current) use of anticoagulants: Secondary | ICD-10-CM | POA: Diagnosis not present

## 2021-02-06 DIAGNOSIS — I2699 Other pulmonary embolism without acute cor pulmonale: Secondary | ICD-10-CM

## 2021-02-06 DIAGNOSIS — Z86718 Personal history of other venous thrombosis and embolism: Secondary | ICD-10-CM | POA: Diagnosis not present

## 2021-02-06 LAB — D-DIMER, QUANTITATIVE: D-Dimer, Quant: 0.28 ug/mL-FEU (ref 0.00–0.50)

## 2021-02-11 ENCOUNTER — Encounter (HOSPITAL_COMMUNITY): Payer: Self-pay

## 2021-02-12 NOTE — Progress Notes (Signed)
[facility] [address], [place] [address]   CLINIC:  Medical Oncology/Hematology  PCP:  System, Provider Not In None  None  REASON FOR VISIT:  Follow-up for PE  PRIOR THERAPY: Eliquis for 6 months in 01/2013  CURRENT THERAPY: Eliquis 5 mg BID  INTERVAL HISTORY:  [name], a 86 y.o. male, returns for routine follow-up for his PE. Kamarian was last seen on 08/07/2020.  Today he reports feeling good. He is walking with the assistance of a walker and has not had any recent falls. He reports jerking of his left leg. He denies any current bleedings.   REVIEW OF SYSTEMS:  Review of Systems  Constitutional:  Negative for appetite change and fatigue (75%).  HENT:   Negative for nosebleeds.   Respiratory:  Negative for hemoptysis.   Gastrointestinal:  Positive for constipation. Negative for blood in stool.  Genitourinary:  Negative for hematuria.   Neurological:  Positive for dizziness.  Hematological:  Does not bruise/bleed easily.  All other systems reviewed and are negative.  PAST MEDICAL/SURGICAL HISTORY:  Past Medical History:  Diagnosis Date   Dementia [facility])    wife denies history of dementia.   GERD (gastroesophageal reflux disease)    Parkinson disease (HCC)    Thyroid disease    Past Surgical History:  Procedure Laterality Date   DEEP BRAIN STIMULATOR PLACEMENT     PACEMAKER PLACEMENT      SOCIAL HISTORY:  Social History   Socioeconomic History   Marital status: Married    Spouse name: Not on file   Number of children: Not on file   Years of education: Not on file   Highest education level: Not on file  Occupational History   Not on file  Tobacco Use   Smoking status: Never   Smokeless tobacco: Never  Vaping Use   Vaping Use: Never used  Substance and Sexual Activity   Alcohol use: Never   Drug use: Never   Sexual activity: Never  Other Topics Concern   Not on file  Social History Narrative   Not on file    Social Determinants of Health   Financial Resource Strain: Not on file  Food Insecurity: Not on file  Transportation Needs: Not on file  Physical Activity: Not on file  Stress: Not on file  Social Connections: Not on file  Intimate Partner Violence: Not on file    FAMILY HISTORY:  No family history on file.  CURRENT MEDICATIONS:  Current Outpatient Medications  Medication Sig Dispense Refill   apixaban (ELIQUIS) 5 MG TABS tablet Take 1 tablet (5 mg total) by mouth 2 (two) times daily. 120 tablet 0   ascorbic acid (VITAMIN C) 500 MG tablet Take 1 tablet by mouth 2 (two) times daily.     carbidopa-levodopa (SINEMET IR) 25-100 MG tablet Take 1 tablet by mouth every 3 (three) hours.     Coenzyme Q10 10 MG capsule Take 1 tablet by mouth daily.      DHEA 25 MG CAPS Take 1 tablet by mouth daily.     dorzolamide-timolol (COSOPT) 22.3-6.8 MG/ML ophthalmic solution Place 1 drop into both eyes 2 (two) times daily.      enoxaparin (LOVENOX) 100 MG/ML injection Inject 1 mL (100 mg total) into the skin daily. 3 mL 0   Garlic 100 MG TABS Take 1 tablet by mouth daily.      GLUTATHIONE PO Take 1 tablet by mouth daily.  KRILL OIL PO Take 1 tablet by mouth daily.     Multiple Vitamins-Minerals (MULTIVITAMIN WITH MINERALS) tablet Take 1 tablet by mouth daily.     omeprazole (PRILOSEC) 40 MG capsule Take 40 mg by mouth daily.     polyethylene glycol (MIRALAX / GLYCOLAX) packet Take 17 g by mouth daily. 14 each 0   tamsulosin (FLOMAX) 0.4 MG CAPS capsule Take 0.4 mg by mouth 3 (three) times daily. Take two capsule three times daily     thyroid (ARMOUR) 30 MG tablet Take 1 tablet by mouth daily.      TRAVATAN Z 0.004 % SOLN ophthalmic solution Place 1 drop into both eyes at bedtime.      No current facility-administered medications for this visit.    ALLERGIES:  No Known Allergies  PHYSICAL EXAM:  Performance status (ECOG): 1 - Symptomatic but completely ambulatory  There were no vitals  filed for this visit. Wt Readings from Last 3 Encounters:  09/17/20 150 lb (68 kg)  08/07/20 160 lb 2 oz (72.6 kg)  06/07/20 160 lb (72.6 kg)   Physical Exam Vitals reviewed.  Constitutional:      Appearance: Normal appearance.     Comments: In wheelchair  Cardiovascular:     Rate and Rhythm: Normal rate and regular rhythm.     Pulses: Normal pulses.     Heart sounds: Normal heart sounds.  Pulmonary:     Effort: Pulmonary effort is normal.     Breath sounds: Normal breath sounds.  Neurological:     General: No focal deficit present.     Mental Status: He is alert and oriented to person, place, and time.  Psychiatric:        Mood and Affect: Mood normal.        Behavior: Behavior normal.    LABORATORY DATA:  I have reviewed the labs as listed.  CBC Latest Ref Rng & Units 06/07/2020 01/04/2020 01/03/2020  WBC 4.0 - 10.5 K/uL 7.1 12.2(H) 8.0  Hemoglobin 13.0 - 17.0 g/dL 82.5 12.9(L) 12.8(L)  Hematocrit 39.0 - 52.0 % 42.6 40.6 39.7  Platelets 150 - 400 K/uL 230 315 284   CMP Latest Ref Rng & Units 06/07/2020 01/04/2020 01/03/2020  Glucose 70 - 99 mg/dL 053(Z) 767(H) 419(F)  BUN 8 - 23 mg/dL 79(K) 24(O) 97(D)  Creatinine 0.61 - 1.24 mg/dL 5.32 9.92 4.26  Sodium 135 - 145 mmol/L 134(L) 136 138  Potassium 3.5 - 5.1 mmol/L 4.2 4.1 4.1  Chloride 98 - 111 mmol/L 104 102 104  CO2 22 - 32 mmol/L 24 26 26   Calcium 8.9 - 10.3 mg/dL ) 9.3 9.3  Total Protein 6.5 - 8.1 g/dL 6.0(L) - -  Total Bilirubin 0.3 - 1.2 mg/dL 0.6 - -  Alkaline Phos 38 - 126 U/L 63 - -  AST 15 - 41 U/L 19 - -  ALT 0 - 44 U/L 6 - -      Component Value Date/Time   RBC 4.47 06/07/2020 1958   MCV 95.3 06/07/2020 1958   MCH 30.6 06/07/2020 1958   MCHC 32.2 06/07/2020 1958   RDW 13.2 06/07/2020 1958   LYMPHSABS 1.4 06/07/2020 1958   MONOABS 0.7 06/07/2020 1958   EOSABS 0.4 06/07/2020 1958   BASOSABS 0.1 06/07/2020 1958    DIAGNOSTIC IMAGING:  I have independently reviewed the scans and discussed with  the patient. No results found.   ASSESSMENT:  1.  Unprovoked pulmonary embolism: -CT angio on 12/28/2019 shows acute bilateral pulmonary  emboli with CT evidence of right heart strain. -Patient was not severely immobile although his mobility is slightly limited by Parkinson's disease. -He is currently on Eliquis.  He is currently a resident at [facility] nursing home in [place]. -He had DVT/PE in the leg few years ago (October 2014) and was treated with 6 months of anticoagulation. -Reviewed labs from recent hospitalization from 01/01/2020.  Factor V Leiden, prothrombin gene mutation, lupus anticoagulant, beta-2 glycoprotein 1 antibody, anticardiolipin antibody were negative.  Protein C&S levels were normal.  Antithrombin III was normal. -As he had unprovoked to weakly provoked pulmonary embolism with right heart strain, I have recommended indefinite anticoagulation at this time.  He also had DVT/PE in 2014, precipitating circumstances unknown.  Patient is a poor historian.   2.  Parkinson's disease: -He has deep brain stimulator (DBS) placed at [facility] in 2009.  He has dysarthria and hypophonia. -Battery was changed on 02/14/2020.   PLAN:  1.  Unprovoked pulmonary embolism: - He is continuing Eliquis twice daily.  He ambulates with the help of a walker. - He denies any falls from last visit.  No bleeding issues reported. - Labs today show D-dimer within normal limits of 0.28. - He is apparently having to pay $300 of co-pay for refill of 52-month prescription. - We will reach out to our financial counselor to see if alternative Xarelto is a cheaper option. - At this time benefits of continuing Eliquis outweigh risks. - Reevaluate risk-benefit ratio in 6 months.  Orders placed this encounter:  No orders of the defined types were placed in this encounter.    Doreatha Massed, MD [facility] [phone]   I, Alda Ponder, am acting as a scribe for Dr. Doreatha Massed.  I, Doreatha Massed MD, have reviewed the above documentation for accuracy and completeness, and I agree with the above.

## 2021-02-13 ENCOUNTER — Other Ambulatory Visit: Payer: Self-pay

## 2021-02-13 ENCOUNTER — Inpatient Hospital Stay (HOSPITAL_BASED_OUTPATIENT_CLINIC_OR_DEPARTMENT_OTHER): Payer: Medicare Other | Admitting: Hematology

## 2021-02-13 DIAGNOSIS — I2699 Other pulmonary embolism without acute cor pulmonale: Secondary | ICD-10-CM | POA: Diagnosis not present

## 2021-02-13 DIAGNOSIS — Z86711 Personal history of pulmonary embolism: Secondary | ICD-10-CM | POA: Diagnosis not present

## 2021-02-13 DIAGNOSIS — Z23 Encounter for immunization: Secondary | ICD-10-CM | POA: Diagnosis not present

## 2021-02-13 MED ORDER — INFLUENZA VAC A&B SA ADJ QUAD 0.5 ML IM PRSY: 0.5000 mL | PREFILLED_SYRINGE | Freq: Once | INTRAMUSCULAR | Status: DC

## 2021-02-13 NOTE — Patient Instructions (Addendum)
[facility] at [facility] Discharge Instructions   You were seen and examined today by Dr. Ellin Saba. Continue Eliquis as prescribed.  Return as scheduled for lab work and office visit.    Thank you for choosing [facility] at [facility] to provide your oncology and hematology care.  To afford each patient quality time with our provider, please arrive at least 15 minutes before your scheduled appointment time.   If you have a lab appointment with the Cancer Center please come in thru the Main Entrance and check in at the main information desk.  You need to re-schedule your appointment should you arrive 10 or more minutes late.  We strive to give you quality time with our providers, and arriving late affects you and other patients whose appointments are after yours.  Also, if you no show three or more times for appointments you may be dismissed from the clinic at the providers discretion.     Again, thank you for choosing [facility].  Our hope is that these requests will decrease the amount of time that you wait before being seen by our physicians.       _____________________________________________________________  Should you have questions after your visit to [facility], please contact our office at [phone] and follow the prompts.  Our office hours are 8:00 a.m. and 4:30 p.m. Monday - Friday.  Please note that voicemails left after 4:00 p.m. may not be returned until the following business day.  We are closed weekends and major holidays.  You do have access to a nurse 24-7, just call the main number to the clinic [phone] and do not press any options, hold on the line and a nurse will answer the phone.    For prescription refill requests, have your pharmacy contact our office and allow 72 hours.    Due to Covid, you will need to wear a mask upon entering the hospital. If you do not have a mask, a mask will be  given to you at the Main Entrance upon arrival. For doctor visits, patients may have 1 support person age 39 or older with them. For treatment visits, patients can not have anyone with them due to social distancing guidelines and our immunocompromised population.

## 2021-02-14 ENCOUNTER — Telehealth (HOSPITAL_COMMUNITY): Payer: Self-pay | Admitting: Hematology

## 2021-04-01 ENCOUNTER — Ambulatory Visit (INDEPENDENT_AMBULATORY_CARE_PROVIDER_SITE_OTHER): Payer: Medicare Other

## 2021-04-01 ENCOUNTER — Other Ambulatory Visit: Payer: Self-pay

## 2021-04-01 ENCOUNTER — Ambulatory Visit
Admission: EM | Admit: 2021-04-01 | Discharge: 2021-04-01 | Disposition: A | Discharge status: Home / Self Care | Payer: Medicare Other | Attending: Family Medicine | Admitting: Family Medicine

## 2021-04-01 DIAGNOSIS — R0789 Other chest pain: Secondary | ICD-10-CM

## 2021-04-01 DIAGNOSIS — Z7901 Long term (current) use of anticoagulants: Secondary | ICD-10-CM

## 2021-04-01 DIAGNOSIS — Z86711 Personal history of pulmonary embolism: Secondary | ICD-10-CM

## 2021-04-01 NOTE — ED Triage Notes (Signed)
Pt presents with c/o chest discomfort, reports that it feels funny, was told by pcp to be seen by cardiologist  2 weeks ago

## 2021-04-03 ENCOUNTER — Other Ambulatory Visit: Payer: Self-pay

## 2021-04-03 ENCOUNTER — Encounter (HOSPITAL_COMMUNITY): Payer: Self-pay

## 2021-04-03 ENCOUNTER — Emergency Department (HOSPITAL_COMMUNITY)
Admission: EM | Admit: 2021-04-03 | Discharge: 2021-04-03 | Disposition: A | Discharge status: Home / Self Care | Payer: Medicare Other | Attending: Emergency Medicine | Admitting: Emergency Medicine

## 2021-04-03 ENCOUNTER — Emergency Department (HOSPITAL_COMMUNITY): Payer: Medicare Other

## 2021-04-03 DIAGNOSIS — R079 Chest pain, unspecified: Secondary | ICD-10-CM

## 2021-04-03 DIAGNOSIS — Z7901 Long term (current) use of anticoagulants: Secondary | ICD-10-CM | POA: Insufficient documentation

## 2021-04-03 DIAGNOSIS — F0390 Unspecified dementia without behavioral disturbance: Secondary | ICD-10-CM | POA: Diagnosis not present

## 2021-04-03 DIAGNOSIS — K219 Gastro-esophageal reflux disease without esophagitis: Secondary | ICD-10-CM | POA: Diagnosis not present

## 2021-04-03 DIAGNOSIS — R0789 Other chest pain: Secondary | ICD-10-CM | POA: Diagnosis present

## 2021-04-03 DIAGNOSIS — G20 Parkinson's disease: Secondary | ICD-10-CM | POA: Diagnosis not present

## 2021-04-03 LAB — CBC
HCT: 42.3 % (ref 39.0–52.0)
Hemoglobin: 13.7 g/dL (ref 13.0–17.0)
MCH: 31.1 pg (ref 26.0–34.0)
MCHC: 32.4 g/dL (ref 30.0–36.0)
MCV: 96.1 fL (ref 80.0–100.0)
Platelets: 256 10*3/uL (ref 150–400)
RBC: 4.4 MIL/uL (ref 4.22–5.81)
RDW: 13.1 % (ref 11.5–15.5)
WBC: 7.5 10*3/uL (ref 4.0–10.5)
nRBC: 0 % (ref 0.0–0.2)

## 2021-04-03 LAB — BASIC METABOLIC PANEL
Anion gap: 7 (ref 5–15)
BUN: 34 mg/dL — ABNORMAL HIGH (ref 8–23)
CO2: 26 mmol/L (ref 22–32)
Calcium: 9.3 mg/dL (ref 8.9–10.3)
Chloride: 103 mmol/L (ref 98–111)
Creatinine, Ser: 1.21 mg/dL (ref 0.61–1.24)
GFR, Estimated: 59 mL/min — ABNORMAL LOW (ref >60)
Glucose, Bld: 101 mg/dL — ABNORMAL HIGH (ref 70–99)
Potassium: 4.6 mmol/L (ref 3.5–5.1)
Sodium: 136 mmol/L (ref 135–145)

## 2021-04-03 LAB — TROPONIN I (HIGH SENSITIVITY)
Troponin I (High Sensitivity): 4 ng/L (ref <18)
Troponin I (High Sensitivity): 5 ng/L (ref <18)

## 2021-04-03 NOTE — Discharge Instructions (Signed)
I provided you with a cardiology follow-up visit.  Please call the phone number for the [facility] tomorrow morning to set up a follow-up appointment of her ED visit. Please return to the emergency department if you develop worsening symptoms such as the pain radiating down arms, worsening pain, shortness of breath, or other concerning symptoms.

## 2021-04-03 NOTE — ED Triage Notes (Signed)
Pt with hx of dementia presents with intermittent generalized chest pain x 1 month. Denies shob, dizziness, back pain, nausea. Advised by PCP to see cardiologist but has been unable to make appointment. No pain at time of triage.

## 2021-04-03 NOTE — ED Notes (Signed)
Pt was able to ambulate with his walker to the bathroom.

## 2021-04-03 NOTE — ED Notes (Signed)
EDP at bedside  

## 2021-04-03 NOTE — ED Provider Notes (Signed)
[facility] EMERGENCY DEPARTMENT Provider Note   CSN: [id number] Arrival date & time: 04/03/21  1141     History Chief Complaint  Patient presents with   Chest Pain    Gary Sellers is a 86 y.o. male.  Patient noted to have a past medical history of Parkinson's, pulmonary embolism on anticoagulation, GERD, deep brain stimulator placement.  He also has a history noted in his chart of dementia, however wife does not know that he has ever had a formal diagnosis of this.  Patient presents to the emergency department with intermittent chest tightness that has been occurring over the past month.  Apparently this is in the middle of his chest and says he feels like pressure.  The pain is not severe.  Usually occurs at rest.  No other associated symptoms.  He is supposed to be seeing a cardiologist, however the appointment has not yet been made.  Currently his PCP sent him here today to the emergency department, however the patient symptoms have not changed. He denies any shortness of breath, dizziness, back pain, vision changes, nausea, vomiting, leg swelling, abdominal pain.   Chest Pain Associated symptoms: no abdominal pain, no back pain, no cough, no dizziness, no fever, no headache, no nausea, no palpitations, no shortness of breath, no vomiting and no weakness    HPI: A 86 year old patient presents for evaluation of chest pain. Initial onset of pain was more than 6 hours ago. The patient's chest pain is described as heaviness/pressure/tightness and is not worse with exertion. The patient's chest pain is middle- or left-sided, is not well-localized, is not sharp and does not radiate to the arms/jaw/neck. The patient does not complain of nausea and denies diaphoresis. The patient has no history of stroke, has no history of peripheral artery disease, has not smoked in the past 90 days, denies any history of treated diabetes, has no relevant family history of coronary artery  disease (first degree relative at less than age 72), is not hypertensive, has no history of hypercholesterolemia and does not have an elevated BMI (>=30).   Past Medical History:  Diagnosis Date   Dementia [facility])    wife denies history of dementia.   GERD (gastroesophageal reflux disease)    Parkinson disease ([place])    Thyroid disease     Patient Active Problem List   Diagnosis Date Noted   PE (pulmonary thromboembolism) ([place]) 12/28/2019    Past Surgical History:  Procedure Laterality Date   DEEP BRAIN STIMULATOR PLACEMENT     PACEMAKER PLACEMENT         History reviewed. No pertinent family history.  Social History   Tobacco Use   Smoking status: Never   Smokeless tobacco: Never  Vaping Use   Vaping Use: Never used  Substance Use Topics   Alcohol use: Never   Drug use: Never    Home Medications Prior to Admission medications   Medication Sig Start Date End Date Taking? Authorizing Provider  anastrozole (ARIMIDEX) 1 MG tablet Take by mouth. 01/26/21   [provider]  apixaban (ELIQUIS) 5 MG TABS tablet Take 1 tablet (5 mg total) by mouth 2 (two) times daily. 10/04/20   Derek Jack, MD  ascorbic acid (VITAMIN C) 500 MG tablet Take 1 tablet by mouth 2 (two) times daily.    [provider]  carbidopa-levodopa (SINEMET IR) 25-100 MG tablet Take 1 tablet by mouth every 3 (three) hours. 12/09/19   [provider]  Coenzyme Q10 10  MG capsule Take 1 tablet by mouth daily.     [provider]  DHEA 25 MG CAPS Take 1 tablet by mouth daily.    [provider]  dorzolamide-timolol (COSOPT) 22.3-6.8 MG/ML ophthalmic solution Place 1 drop into both eyes 2 (two) times daily.  11/25/12   [provider]  enoxaparin (LOVENOX) 100 MG/ML injection Inject 1 mL (100 mg total) into the skin daily. 02/06/20   Doreatha Massed, MD  Garlic 100 MG TABS Take 1 tablet by mouth daily.     [provider]  GLUTATHIONE PO Take 1  tablet by mouth daily.     [provider]  KRILL OIL PO Take 1 tablet by mouth daily.    [provider]  Multiple Vitamins-Minerals (MULTIVITAMIN WITH MINERALS) tablet Take 1 tablet by mouth daily.    [provider]  omeprazole (PRILOSEC) 40 MG capsule Take 40 mg by mouth daily. 01/26/13   [provider]  polyethylene glycol (MIRALAX / GLYCOLAX) packet Take 17 g by mouth daily. 01/07/18   Mesner, Barbara Cower, MD  tamsulosin (FLOMAX) 0.4 MG CAPS capsule Take 0.4 mg by mouth 3 (three) times daily. Take two capsule three times daily    [provider]  thyroid (ARMOUR) 30 MG tablet Take 1 tablet by mouth daily.     [provider]  TRAVATAN Z 0.004 % SOLN ophthalmic solution Place 1 drop into both eyes at bedtime.  11/25/12   [provider]    Allergies    Augmentin [amoxicillin-pot clavulanate]  Review of Systems   Review of Systems  Constitutional:  Negative for chills and fever.  HENT:  Negative for congestion, rhinorrhea and sore throat.   Eyes:  Negative for visual disturbance.  Respiratory:  Negative for cough, chest tightness and shortness of breath.   Cardiovascular:  Positive for chest pain. Negative for palpitations and leg swelling.  Gastrointestinal:  Negative for abdominal pain, blood in stool, constipation, diarrhea, nausea and vomiting.  Genitourinary:  Negative for dysuria, flank pain and hematuria.  Musculoskeletal:  Negative for back pain.  Skin:  Negative for rash and wound.  Neurological:  Negative for dizziness, syncope, weakness, light-headedness and headaches.  Psychiatric/Behavioral:  Negative for confusion.   All other systems reviewed and are negative.  Physical Exam Updated Vital Signs BP 139/83    Pulse 76    Temp 98 F (36.7 C) (Oral)    Resp 18    Ht 6' (1.829 m)    Wt 68 kg    SpO2 95%    BMI 20.33 kg/m   Physical Exam Vitals and nursing note reviewed.  Constitutional:      General: He is not in  acute distress.    Appearance: Normal appearance. He is not ill-appearing, toxic-appearing or diaphoretic.  HENT:     Head: Normocephalic and atraumatic.     Ears:     Comments: Significant hearing loss noted bilaterally.    Nose: No nasal deformity.     Mouth/Throat:     Lips: Pink. No lesions.     Mouth: Mucous membranes are moist. No injury, lacerations, oral lesions or angioedema.     Pharynx: Oropharynx is clear. Uvula midline. No pharyngeal swelling, oropharyngeal exudate, posterior oropharyngeal erythema or uvula swelling.  Eyes:     General: Gaze aligned appropriately. No scleral icterus.       Right eye: No discharge.        Left eye: No discharge.  Conjunctiva/sclera: Conjunctivae normal.     Right eye: Right conjunctiva is not injected. No exudate or hemorrhage.    Left eye: Left conjunctiva is not injected. No exudate or hemorrhage. Neck:     Trachea: No tracheal deviation.  Cardiovascular:     Rate and Rhythm: Normal rate and regular rhythm.     Pulses: Normal pulses.          Radial pulses are 2+ on the right side and 2+ on the left side.       Dorsalis pedis pulses are 2+ on the right side and 2+ on the left side.     Heart sounds: Normal heart sounds, S1 normal and S2 normal. Heart sounds not distant. No murmur heard.   No friction rub. No gallop. No S3 or S4 sounds.  Pulmonary:     Effort: Pulmonary effort is normal. No tachypnea, accessory muscle usage or respiratory distress.     Breath sounds: Normal breath sounds. No stridor. No decreased breath sounds, wheezing, rhonchi or rales.  Chest:     Chest wall: No tenderness.  Abdominal:     General: Abdomen is flat. Bowel sounds are normal. There is no distension.     Palpations: Abdomen is soft. There is no mass or pulsatile mass.     Tenderness: There is no abdominal tenderness. There is no guarding or rebound.  Musculoskeletal:     Right lower leg: No edema.     Left lower leg: No edema.  Skin:    General:  Skin is warm and dry.     Coloration: Skin is not jaundiced or pale.     Findings: No bruising, erythema, lesion or rash.  Neurological:     General: No focal deficit present.     Mental Status: He is alert. Mental status is at baseline.     GCS: GCS eye subscore is 4. GCS verbal subscore is 5. GCS motor subscore is 6.     Cranial Nerves: No dysarthria.  Psychiatric:        Mood and Affect: Mood normal.        Speech: Speech is delayed.        Behavior: Behavior normal. Behavior is cooperative.    ED Results / Procedures / Treatments   Labs (all labs ordered are listed, but only abnormal results are displayed) Labs Reviewed  BASIC METABOLIC PANEL - Abnormal; Notable for the following components:      Result Value   Glucose, Bld 101 (*)    BUN 34 (*)    GFR, Estimated 59 (*)    All other components within normal limits  CBC  TROPONIN I (HIGH SENSITIVITY)  TROPONIN I (HIGH SENSITIVITY)    EKG None  Radiology DG Chest 2 View  Result Date: 04/03/2021 CLINICAL DATA:  Chest pain EXAM: CHEST - 2 VIEW COMPARISON:  None. FINDINGS: The heart size and mediastinal contours are within normal limits. No focal consolidation or large pleural effusion. Mild bibasilar atelectasis. The visualized skeletal structures are unremarkable. Left chest spinal stimulator with leads in the left neck. IMPRESSION: No active cardiopulmonary disease. Electronically Signed   By: Keane Police D.O.   On: 04/03/2021 13:50    Procedures Procedures   Medications Ordered in ED Medications - No data to display  ED Course  I have reviewed the triage vital signs and the nursing notes.  Pertinent labs & imaging results that were available during my care of the patient were reviewed by me and  considered in my medical decision making (see chart for details).    MDM Rules/Calculators/A&P HEAR Score: 4                         This is a 86 y.o. male with a PMH of  Parkinson's, pulmonary embolism on  anticoagulation, GERD, deep brain stimulator placement.  He also has a history noted in his chart of dementia who presents to the ED with intermittent chest pains that have been occurring over the past month.  Symptoms typically occur at rest and have been overall unchanged throughout this month.  He has had no associated symptoms.  His PCP has mentioned him seeing a cardiologist soon, however he has not had this appointment set up yet.  Vitals: Hemodynamically stable.  Normotensive.  Oxygenating well on room air.  Afebrile.  Patient is well appearing and in no acute distress. Exam is overall nonconcerning.  Pain is not reproducible to palpation.  He has no abdominal tenderness.  His pulses are normal in all extremities.  He is extremely hard of hearing, so history is somewhat difficult to obtain, however he is accompanied by his wife who is able to fill in the details.  I personally reviewed all laboratory work and imaging. Abnormal results outlined below. His CBC is unremarkable.  BMP with no acute abnormalities.  Troponins were negative x2.  Chest x-ray with no acute abnormalities.  EKG with nonspecific T wave abnormalities which have been seen on previous views.  Interpretation:  Cardiac work-up is overall unremarkable.  His main risk factor is his age, however he has low comorbidity risk.  His heart score is a 4.  However, given the chronicity of the patient's symptoms, I do not feel that he needs to be admitted.  I do think he could benefit from outpatient cardiology follow-up.  Dispo Plan: Discharge home with close cardiology follow-up.  Strict return precautions provided.  I have seen and evaluated this patient in conjunction with my attending physician who agrees and has made changes to the plan accordingly.  Portions of this note were generated with [employer]. Dictation errors may occur despite best attempts at proofreading.   Final Clinical Impression(s) / ED  Diagnoses Final diagnoses:  Nonspecific chest pain    Rx / DC Orders ED Discharge Orders     None        Adolphus Birchwood, PA-C 04/03/21 2218    Drenda Freeze, MD 04/07/21 1505

## 2021-04-05 NOTE — ED Provider Notes (Signed)
[facility]    CSN: [id number] Arrival date & time: 04/01/21  1437      History   Chief Complaint Chief Complaint  Patient presents with   Chest Pain    HPI Gary Sellers is a 86 y.o. male.   Patient presenting today with son for evaluation of 2-week history of "feeling funny "in his chest.  He states it is not a pain or specifically a pressure, but just feeling different than usual.  He denies palpitations, difficulty breathing, dizziness, syncope, nausea, vomiting, diaphoresis, radiation of pain, numbness down arms, mental status changes, cough or recent illness.  PCP had recommended him to be seen by cardiologist 2 weeks ago but son states he has been calling around and cannot find anybody to see him right away.  Has not been trying anything over-the-counter for symptoms.  Past medical history significant for a pulmonary embolism on chronic Eliquis, Parkinson's disease.  No known past history of MI, CVA.   Past Medical History:  Diagnosis Date   Dementia [facility])    wife denies history of dementia.   GERD (gastroesophageal reflux disease)    Parkinson disease (HCC)    Thyroid disease     Patient Active Problem List   Diagnosis Date Noted   PE (pulmonary thromboembolism) (HCC) 12/28/2019    Past Surgical History:  Procedure Laterality Date   DEEP BRAIN STIMULATOR PLACEMENT     PACEMAKER PLACEMENT         Home Medications    Prior to Admission medications   Medication Sig Start Date End Date Taking? Authorizing Provider  anastrozole (ARIMIDEX) 1 MG tablet Take by mouth. 01/26/21   [provider]  apixaban (ELIQUIS) 5 MG TABS tablet Take 1 tablet (5 mg total) by mouth 2 (two) times daily. 10/04/20   Doreatha Massed, MD  ascorbic acid (VITAMIN C) 500 MG tablet Take 1 tablet by mouth 2 (two) times daily.    [provider]  carbidopa-levodopa (SINEMET IR) 25-100 MG tablet Take 1 tablet by mouth every 3 (three) hours. 12/09/19    [provider]  Coenzyme Q10 10 MG capsule Take 1 tablet by mouth daily.     [provider]  DHEA 25 MG CAPS Take 1 tablet by mouth daily.    [provider]  dorzolamide-timolol (COSOPT) 22.3-6.8 MG/ML ophthalmic solution Place 1 drop into both eyes 2 (two) times daily.  11/25/12   [provider]  enoxaparin (LOVENOX) 100 MG/ML injection Inject 1 mL (100 mg total) into the skin daily. 02/06/20   Doreatha Massed, MD  Garlic 100 MG TABS Take 1 tablet by mouth daily.     [provider]  GLUTATHIONE PO Take 1 tablet by mouth daily.     [provider]  KRILL OIL PO Take 1 tablet by mouth daily.    [provider]  Multiple Vitamins-Minerals (MULTIVITAMIN WITH MINERALS) tablet Take 1 tablet by mouth daily.    [provider]  omeprazole (PRILOSEC) 40 MG capsule Take 40 mg by mouth daily. 01/26/13   [provider]  polyethylene glycol (MIRALAX / GLYCOLAX) packet Take 17 g by mouth daily. 01/07/18   Mesner, Barbara Cower, MD  tamsulosin (FLOMAX) 0.4 MG CAPS capsule Take 0.4 mg by mouth 3 (three) times daily. Take two capsule three times daily    [provider]  thyroid (ARMOUR) 30 MG tablet Take 1 tablet by mouth daily.     [provider]  TRAVATAN Z 0.004 %  SOLN ophthalmic solution Place 1 drop into both eyes at bedtime.  11/25/12   [provider]    Family History No family history on file.  Social History Social History   Tobacco Use   Smoking status: Never   Smokeless tobacco: Never  Vaping Use   Vaping Use: Never used  Substance Use Topics   Alcohol use: Never   Drug use: Never     Allergies   Augmentin [amoxicillin-pot clavulanate]   Review of Systems Review of Systems Per HPI  Physical Exam Triage Vital Signs ED Triage Vitals  Enc Vitals Group     BP 04/01/21 1448 102/64     Pulse Rate 04/01/21 1448 69     Resp 04/01/21 1448 20     Temp 04/01/21 1448 (!) 97.5 F  (36.4 C)     Temp Source 04/01/21 1553 Oral     SpO2 04/01/21 1448 93 %     Weight --      Height --      Head Circumference --      Peak Flow --      Pain Score --      Pain Loc --      Pain Edu? --      Excl. in [place]? --    No data found.  Updated Vital Signs BP 105/69 (BP Location: Right Arm)    Pulse 72    Temp 98.1 F (36.7 C) (Oral)    Resp 20    SpO2 96%   Visual Acuity Right Eye Distance:   Left Eye Distance:   Bilateral Distance:    Right Eye Near:   Left Eye Near:    Bilateral Near:     Physical Exam Vitals and nursing note reviewed.  Constitutional:      Appearance: Normal appearance.  HENT:     Head: Atraumatic.     Mouth/Throat:     Mouth: Mucous membranes are moist.  Eyes:     Extraocular Movements: Extraocular movements intact.     Conjunctiva/sclera: Conjunctivae normal.  Cardiovascular:     Rate and Rhythm: Normal rate and regular rhythm.     Heart sounds: Normal heart sounds.  Pulmonary:     Effort: Pulmonary effort is normal.     Breath sounds: Normal breath sounds. No wheezing or rales.  Musculoskeletal:     Cervical back: Normal range of motion and neck supple.     Comments: Range of motion at baseline per patient and son  Skin:    General: Skin is warm and dry.  Neurological:     Mental Status: Mental status is at baseline.  Psychiatric:        Mood and Affect: Mood normal.        Thought Content: Thought content normal.        Judgment: Judgment normal.     UC Treatments / Results  Labs (all labs ordered are listed, but only abnormal results are displayed) Labs Reviewed - No data to display  EKG   Radiology DG Chest 2 View  Result Date: 04/03/2021 CLINICAL DATA:  Chest pain EXAM: CHEST - 2 VIEW COMPARISON:  None. FINDINGS: The heart size and mediastinal contours are within normal limits. No focal consolidation or large pleural effusion. Mild bibasilar atelectasis. The visualized skeletal structures are unremarkable. Left  chest spinal stimulator with leads in the left neck. IMPRESSION: No active cardiopulmonary disease. Electronically Signed   By: Keane Police D.O.   On: 04/03/2021  13:50    Procedures Procedures (including critical care time)  Medications Ordered in UC Medications - No data to display  Initial Impression / Assessment and Plan / UC Course  I have reviewed the triage vital signs and the nursing notes.  Pertinent labs & imaging results that were available during my care of the patient were reviewed by me and considered in my medical decision making (see chart for details).     Vital signs benign and reassuring, exam without obvious abnormality, chest x-ray negative for acute cardiopulmonary abnormality, EKG showing normal sinus rhythm at 68 bpm with Nonspecific T wave change unchanged from previous EKG on record.  Discussed with patient and family member that we are unable to fully rule out a cardiac event in the setting and recommended further evaluation in the emergency department if symptoms worsening or not improving.  They wish to continue trying to get in with outpatient cardiology at this time but are agreeable to going to the emergency department for worsening symptoms in the future.  45 minutes spent today in direct patient care, coordination and evaluation, education  Final Clinical Impressions(s) / UC Diagnoses   Final diagnoses:  Chest pressure  History of pulmonary embolism  Chronic anticoagulation   Discharge Instructions   None    ED Prescriptions   None    PDMP not reviewed this encounter.   Volney American, [place] 04/05/21 [phone]

## 2021-04-12 ENCOUNTER — Other Ambulatory Visit (HOSPITAL_COMMUNITY): Payer: Self-pay | Admitting: Hematology

## 2021-04-16 ENCOUNTER — Other Ambulatory Visit (HOSPITAL_COMMUNITY): Payer: Self-pay | Admitting: *Deleted

## 2021-06-03 ENCOUNTER — Other Ambulatory Visit (HOSPITAL_COMMUNITY): Payer: Self-pay

## 2021-06-03 DIAGNOSIS — R1310 Dysphagia, unspecified: Secondary | ICD-10-CM

## 2021-06-03 DIAGNOSIS — R059 Cough, unspecified: Secondary | ICD-10-CM

## 2021-06-12 ENCOUNTER — Other Ambulatory Visit: Payer: Self-pay

## 2021-06-12 ENCOUNTER — Ambulatory Visit (HOSPITAL_COMMUNITY): Payer: Medicare Other

## 2021-06-12 ENCOUNTER — Ambulatory Visit (HOSPITAL_COMMUNITY)
Admission: RE | Admit: 2021-06-12 | Discharge: 2021-06-12 | Disposition: A | Discharge status: Home / Self Care | Payer: Medicare Other | Source: Ambulatory Visit | Attending: Neurology | Admitting: Neurology

## 2021-06-12 ENCOUNTER — Encounter (HOSPITAL_COMMUNITY): Payer: Self-pay

## 2021-06-12 DIAGNOSIS — R1310 Dysphagia, unspecified: Secondary | ICD-10-CM | POA: Diagnosis present

## 2021-06-12 DIAGNOSIS — R1319 Other dysphagia: Secondary | ICD-10-CM | POA: Insufficient documentation

## 2021-06-12 DIAGNOSIS — R059 Cough, unspecified: Secondary | ICD-10-CM | POA: Diagnosis present

## 2021-06-12 DIAGNOSIS — G20 Parkinson's disease: Secondary | ICD-10-CM | POA: Insufficient documentation

## 2021-06-12 NOTE — Progress Notes (Signed)
Objective Swallowing Evaluation: Type of Study: MBS-Modified Barium Swallow Study   Patient Details  Name: Gary Sellers MRN: [id number] Date of Birth: 1935-02-15  Today's Date: 06/12/2021 Time: SLP Start Time (ACUTE ONLY): 14 -SLP Stop Time (ACUTE ONLY): 1145  SLP Time Calculation (min) (ACUTE ONLY): 15 min   Past Medical History:  Past Medical History:  Diagnosis Date   Dementia ([place])    wife denies history of dementia.   GERD (gastroesophageal reflux disease)    Parkinson disease (HCC)    Thyroid disease    Past Surgical History:  Past Surgical History:  Procedure Laterality Date   DEEP BRAIN STIMULATOR PLACEMENT     PACEMAKER PLACEMENT     HPI: Pt is a 87 y.o. male who presents for OP MBS per wife reports of coughing during meals and HH SLP requesting instrumental assessment. Per wife, pt largely has difficulties with solids. He consumes a regular, thin liquid diet at baseline. CXR 04/03/21 was without active cardiopulmonary disease. MBS (06/07/20) revealed min oropharyngeal dysphagia. x1 episode of flash penetration noted, no aspiration observed. Regular, thin liquid diet recommended at that time. PMH: Parkinson's, pulmonary embolism on anticoagulation, GERD, deep brain stimulator placement.   No data recorded   Recommendations for follow up therapy are one component of a multi-disciplinary discharge planning process, led by the attending physician.  Recommendations may be updated based on patient status, additional functional criteria and insurance authorization.  Assessment / Plan / Recommendation  Clinical Impressions 06/12/2021  Clinical Impression Pt presents with very mild oropharyngeal dysphagia and is considered functional. He demonstrates reduced bolus cohesion and premature spillage, with thin liquids filling pyriforms prior to initation of swallow. x1 instance of flash penetration (PAS 2) observed with consecutive sips via straw when swallowing barium pill.  Otherwise, no aspiration or penetration observed. Pharyngeal residuals increased primarily with consecutive sips of thin via straw, but remained trace-mild. Vallecular packing of puree and regular textures noted, with timely swallow trigger and clearance of majority of bolus from pharynx. 48mm barium pill passed cervical esophagus without difficulty. Prominent CP segment observed via fluoro, with residuals remaining just below and were transient in nature. Recommend continue regular, thin liquid diet. Pt would benefit from small bites/sip at slow rate and performing repeat dry swallows to increase clearance of pharyngeal residuals. Results and recommendaitons discussed with pt and wife. Further intervention deferred to [facility] SLP.  SLP Visit Diagnosis Dysphagia, oropharyngeal phase (R13.12)  Attention and concentration deficit following --  Frontal lobe and executive function deficit following --  Impact on safety and function Mild aspiration risk      Treatment Recommendations 06/12/2021  Treatment Recommendations Defer treatment plan to f/u with SLP     Prognosis 06/12/2021  Prognosis for Safe Diet Advancement Good  Barriers to Reach Goals Time post onset  Barriers/Prognosis Comment --    Diet Recommendations 06/12/2021  SLP Diet Recommendations Regular solids;Thin liquid  Liquid Administration via Cup;Straw  Medication Administration Whole meds with liquid  Compensations Slow rate;Small sips/bites;Multiple dry swallows after each bite/sip  Postural Changes Remain semi-upright after after feeds/meals (Comment)      Other Recommendations 06/12/2021  Recommended Consults --  Oral Care Recommendations Oral care BID  Other Recommendations --  Follow Up Recommendations Home health SLP  Assistance recommended at discharge Set up Supervision/Assistance  Functional Status Assessment Patient has had a recent decline in their functional status and demonstrates the ability to make significant  improvements in function in a reasonable and predictable amount of time.  No flowsheet data found.    Oral Phase 06/12/2021  Oral Phase Impaired  Oral - Pudding Teaspoon --  Oral - Pudding Cup --  Oral - Honey Teaspoon --  Oral - Honey Cup --  Oral - Nectar Teaspoon --  Oral - Nectar Cup --  Oral - Nectar Straw --  Oral - Thin Teaspoon --  Oral - Thin Cup Decreased bolus cohesion;Premature spillage  Oral - Thin Straw Decreased bolus cohesion;Premature spillage  Oral - Puree --  Oral - Mech Soft --  Oral - Regular --  Oral - Multi-Consistency --  Oral - Pill --  Oral Phase - Comment --    Pharyngeal Phase 06/12/2021  Pharyngeal Phase Impaired  Pharyngeal- Pudding Teaspoon --  Pharyngeal --  Pharyngeal- Pudding Cup --  Pharyngeal --  Pharyngeal- Honey Teaspoon --  Pharyngeal --  Pharyngeal- Honey Cup --  Pharyngeal --  Pharyngeal- Nectar Teaspoon --  Pharyngeal --  Pharyngeal- Nectar Cup --  Pharyngeal --  Pharyngeal- Nectar Straw --  Pharyngeal --  Pharyngeal- Thin Teaspoon --  Pharyngeal --  Pharyngeal- Thin Cup Pharyngeal residue - valleculae;Delayed swallow initiation-pyriform sinuses;Delayed swallow initiation-vallecula;Reduced tongue base retraction  Pharyngeal --  Pharyngeal- Thin Straw Pharyngeal residue - valleculae;Delayed swallow initiation-pyriform sinuses;Delayed swallow initiation-vallecula;Reduced tongue base retraction;Penetration/Aspiration during swallow;Pharyngeal residue - pyriform;Lateral channel residue  Pharyngeal --  Pharyngeal- Puree Pharyngeal residue - valleculae  Pharyngeal --  Pharyngeal- Mechanical Soft --  Pharyngeal --  Pharyngeal- Regular Delayed swallow initiation-vallecula;Delayed swallow initiation-pyriform sinuses;Pharyngeal residue - valleculae  Pharyngeal --  Pharyngeal- Multi-consistency --  Pharyngeal --  Pharyngeal- Pill WFL  Pharyngeal --  Pharyngeal Comment --     Cervical Esophageal Phase  06/12/2021  Cervical  Esophageal Phase Impaired  Pudding Teaspoon --  Pudding Cup --  Honey Teaspoon --  Honey Cup --  Nectar Teaspoon --  Nectar Cup --  Nectar Straw --  Thin Teaspoon --  Thin Cup Prominent cricopharyngeal segment  Thin Straw Prominent cricopharyngeal segment  Puree --  Mechanical Soft --  Regular --  Multi-consistency --  Pill --  Cervical Esophageal Comment prominent CP with transient residuals of thin      Ellwood Dense, MA, [facility] Office Number: [phone]  Acie Fredrickson 06/12/2021, 1:12 PM

## 2021-07-02 ENCOUNTER — Encounter (HOSPITAL_COMMUNITY): Payer: Self-pay

## 2021-07-02 ENCOUNTER — Other Ambulatory Visit: Payer: Self-pay

## 2021-07-02 ENCOUNTER — Emergency Department (HOSPITAL_COMMUNITY)
Admission: EM | Admit: 2021-07-02 | Discharge: 2021-07-02 | Disposition: A | Discharge status: Home / Self Care | Payer: Medicare Other | Attending: Emergency Medicine | Admitting: Emergency Medicine

## 2021-07-02 DIAGNOSIS — G20 Parkinson's disease: Secondary | ICD-10-CM | POA: Diagnosis not present

## 2021-07-02 DIAGNOSIS — I959 Hypotension, unspecified: Secondary | ICD-10-CM | POA: Insufficient documentation

## 2021-07-02 DIAGNOSIS — Z7901 Long term (current) use of anticoagulants: Secondary | ICD-10-CM | POA: Diagnosis not present

## 2021-07-02 LAB — CBC WITH DIFFERENTIAL/PLATELET
Abs Immature Granulocytes: 0.02 10*3/uL (ref 0.00–0.07)
Basophils Absolute: 0.1 10*3/uL (ref 0.0–0.1)
Basophils Relative: 1 %
Eosinophils Absolute: 0.5 10*3/uL (ref 0.0–0.5)
Eosinophils Relative: 6 %
HCT: 46.5 % (ref 39.0–52.0)
Hemoglobin: 15.4 g/dL (ref 13.0–17.0)
Immature Granulocytes: 0 %
Lymphocytes Relative: 15 %
Lymphs Abs: 1.1 10*3/uL (ref 0.7–4.0)
MCH: 31.7 pg (ref 26.0–34.0)
MCHC: 33.1 g/dL (ref 30.0–36.0)
MCV: 95.7 fL (ref 80.0–100.0)
Monocytes Absolute: 0.6 10*3/uL (ref 0.1–1.0)
Monocytes Relative: 8 %
Neutro Abs: 5 10*3/uL (ref 1.7–7.7)
Neutrophils Relative %: 70 %
Platelets: 236 10*3/uL (ref 150–400)
RBC: 4.86 MIL/uL (ref 4.22–5.81)
RDW: 12.8 % (ref 11.5–15.5)
WBC: 7.2 10*3/uL (ref 4.0–10.5)
nRBC: 0 % (ref 0.0–0.2)

## 2021-07-02 LAB — COMPREHENSIVE METABOLIC PANEL
ALT: 11 U/L (ref 0–44)
AST: 18 U/L (ref 15–41)
Albumin: 3.7 g/dL (ref 3.5–5.0)
Alkaline Phosphatase: 85 U/L (ref 38–126)
Anion gap: 9 (ref 5–15)
BUN: 31 mg/dL — ABNORMAL HIGH (ref 8–23)
CO2: 25 mmol/L (ref 22–32)
Calcium: 8.9 mg/dL (ref 8.9–10.3)
Chloride: 101 mmol/L (ref 98–111)
Creatinine, Ser: 1.16 mg/dL (ref 0.61–1.24)
GFR, Estimated: >60 mL/min (ref >60)
Glucose, Bld: 94 mg/dL (ref 70–99)
Potassium: 4.2 mmol/L (ref 3.5–5.1)
Sodium: 135 mmol/L (ref 135–145)
Total Bilirubin: 0.6 mg/dL (ref 0.3–1.2)
Total Protein: 5.9 g/dL — ABNORMAL LOW (ref 6.5–8.1)

## 2021-07-02 LAB — URINALYSIS, ROUTINE W REFLEX MICROSCOPIC
Bilirubin Urine: NEGATIVE
Glucose, UA: NEGATIVE mg/dL
Hgb urine dipstick: NEGATIVE
Ketones, ur: NEGATIVE mg/dL
Leukocytes,Ua: NEGATIVE
Nitrite: NEGATIVE
Protein, ur: NEGATIVE mg/dL
Specific Gravity, Urine: 1.011 (ref 1.005–1.030)
pH: 7 (ref 5.0–8.0)

## 2021-07-02 LAB — LACTIC ACID, PLASMA: Lactic Acid, Venous: 1.1 mmol/L (ref 0.5–1.9)

## 2021-07-02 MED ORDER — SODIUM CHLORIDE 0.9 % IV BOLUS
500.0000 mL | Freq: Once | INTRAVENOUS | Status: AC
  Administered 2021-07-02: 500 mL via INTRAVENOUS

## 2021-07-02 NOTE — ED Triage Notes (Signed)
Patient from home with complaints of hypotension. Denies shortness of breath and chest pain.  ?

## 2021-07-02 NOTE — ED Provider Notes (Signed)
?[facility] ?Provider Note ? ? ?CSN: [id number] ?Arrival date & time: 07/02/21  1012 ? ?  ? ?History ? ?Chief Complaint  ?Patient presents with  ? Hypotension  ? ? ?Gary Sellers is a 87 y.o. male. ? ?HPI ? ?  ?87 year old male comes in with chief complaint of low blood pressure.  Patient has history of Parkinson disease.  He was seen by home health today, his BP was in the 70s systolic and he was sent to the ER.  Patient denies any nausea, vomiting, fevers, chills, chest pain, shortness of breath, dizziness or near fainting spell.  He does indicate that he has generalized weakness but it is not new. ? ?He has had low blood pressure before.  Typically has low blood pressure in the 90s during the morning time and then they get better as the day progresses.  Today's blood pressure was low enough, that home health team sent him to the ER.  Patient denies any new medications.  Wife is at the bedside and provides collateral history. ? ?Home Medications ?Prior to Admission medications   ?Medication Sig Start Date End Date Taking? Authorizing Provider  ?anastrozole (ARIMIDEX) 1 MG tablet Take by mouth. 01/26/21   [provider]  ?ascorbic acid (VITAMIN C) 500 MG tablet Take 1 tablet by mouth 2 (two) times daily.    [provider]  ?carbidopa-levodopa (SINEMET IR) 25-100 MG tablet Take 1 tablet by mouth every 3 (three) hours. 12/09/19   [provider]  ?Coenzyme Q10 10 MG capsule Take 1 tablet by mouth daily.     [provider]  ?DHEA 25 MG CAPS Take 1 tablet by mouth daily.    [provider]  ?dorzolamide-timolol (COSOPT) 22.3-6.8 MG/ML ophthalmic solution Place 1 drop into both eyes 2 (two) times daily.  11/25/12   [provider]  ?Everlene Balls 5 MG TABS tablet Take 1 tablet by mouth twice daily 04/15/21   Doreatha Massed, MD  ?enoxaparin (LOVENOX) 100 MG/ML injection Inject 1 mL (100 mg total) into the skin daily. 02/06/20   Doreatha Massed, MD  ?Garlic 100 MG TABS Take 1 tablet by mouth daily.     [provider]  ?GLUTATHIONE PO Take 1 tablet by mouth daily.     [provider]  ?KRILL OIL PO Take 1 tablet by mouth daily.    [provider]  ?Multiple Vitamins-Minerals (MULTIVITAMIN WITH MINERALS) tablet Take 1 tablet by mouth daily.    [provider]  ?omeprazole (PRILOSEC) 40 MG capsule Take 40 mg by mouth daily. 01/26/13   [provider]  ?polyethylene glycol (MIRALAX / GLYCOLAX) packet Take 17 g by mouth daily. 01/07/18   Mesner, Barbara Cower, MD  ?tamsulosin (FLOMAX) 0.4 MG CAPS capsule Take 0.4 mg by mouth 3 (three) times daily. Take two capsule three times daily    [provider]  ?thyroid (ARMOUR) 30 MG tablet Take 1 tablet by mouth daily.     [provider]  ?TRAVATAN Z 0.004 % SOLN ophthalmic solution Place 1 drop into both eyes at bedtime.  11/25/12   [provider]  ?   ? ?Allergies    ?Augmentin [amoxicillin-pot clavulanate]   ? ?Review of Systems   ?Review of Systems  ?All other systems reviewed and are negative. ? ?Physical Exam ?Updated Vital Signs ?BP (!) 147/76   Pulse 70   Temp 97.9 ?F (36.6 ?C) (Oral)   Resp 13   Ht 6' (1.829 m)  Wt 70.3 kg   SpO2 96%   BMI 21.02 kg/m?  ?Physical Exam ?Vitals and nursing note reviewed.  ?Constitutional:   ?   Appearance: He is well-developed.  ?HENT:  ?   Head: Atraumatic.  ?   Mouth/Throat:  ?   Mouth: Mucous membranes are moist.  ?Cardiovascular:  ?   Rate and Rhythm: Normal rate.  ?Pulmonary:  ?   Effort: Pulmonary effort is normal.  ?Musculoskeletal:  ?   Cervical back: Neck supple.  ?Skin: ?   General: Skin is warm.  ?Neurological:  ?   Mental Status: He is alert and oriented to person, place, and time.  ? ? ?ED Results / Procedures / Treatments   ?Labs ?(all labs ordered are listed, but only abnormal results are displayed) ?Labs Reviewed  ?COMPREHENSIVE METABOLIC PANEL - Abnormal; Notable for the following  components:  ?    Result Value  ? BUN 31 (*)   ? Total Protein 5.9 (*)   ? All other components within normal limits  ?URINALYSIS, ROUTINE W REFLEX MICROSCOPIC - Abnormal; Notable for the following components:  ? APPearance HAZY (*)   ? All other components within normal limits  ?CBC WITH DIFFERENTIAL/PLATELET  ?LACTIC ACID, PLASMA  ? ? ?EKG ?EKG Interpretation ? ?Date/Time:  Tuesday July 02 2021 12:19:31 EDT ?Ventricular Rate:  63 ?PR Interval:  182 ?QRS Duration: 105 ?QT Interval:  422 ?QTC Calculation: 432 ?R Axis:   84 ?Text Interpretation: Sinus rhythm Borderline right axis deviation Borderline T wave abnormalities No acute changes Confirmed by Derwood KaplanNanavati, Lory Nowaczyk [phone]([id number]) on 07/02/2021 6:17:10 PM ? ?Radiology ?No results found. ? ?Procedures ?Procedures  ? ? ?Medications Ordered in ED ?Medications  ?sodium chloride 0.9 % bolus 500 mL (0 mLs Intravenous Stopped 07/02/21 1543)  ? ? ?ED Course/ Medical Decision Making/ A&P ?  ?                        ?Medical Decision Making ?This patient presents to the ED with chief complaint(s) of low blood pressure with pertinent past medical history of Parkinson's disease which further complicates the presenting complaint. The complaint involves an extensive differential diagnosis and treatment options and also carries with it a high risk of complications and morbidity.   ? ?The differential diagnosis includes: ?Autonomic instability because of Parkinson's disease, dehydration, electrolyte abnormality, renal failure. ? ?The initial plan is to order EKG, basic labs to ensure there is no endorgan damage. ?Patient's blood pressure here is normal.  He denies any significant illness and felt that it was a normal day until he was advised to come to the ER. ? ?Additional history obtained: ?Additional history obtained from spouse ?Records reviewed Care Everywhere/External Records  -there is a clear note 2 or 3 weeks ago where patient's blood pressure was in the 90s, and telephone  encounter existed at that time ? ?If patient's lab results are normal, his BP remains stable then we will not need to admit him to the hospital. ? ? ?Amount and/or Complexity of Data Reviewed ?Labs: ordered. ? ?Reassessment: ?The patient appears reasonably screened and/or stabilized for discharge and I doubt any other medical condition or other [facility] requiring further screening, evaluation, or treatment in the ED at this time prior to discharge. ?  ?Results from the ER workup discussed with the patient face to face and all questions answered to the best of my ability. ?The patient is safe for discharge with strict return precautions. ? ? ?  Final Clinical Impression(s) / ED Diagnoses ?Final diagnoses:  ?Hypotension, unspecified hypotension type  ?Parkinson disease (HCC)  ? ? ? ? ?Rx / DC Orders ?ED Discharge Orders   ? ? None  ? ?  ? ? ?  ?Derwood Kaplan, MD ?07/02/21 1819 ? ?

## 2021-07-02 NOTE — Discharge Instructions (Addendum)
You are seen in the ER for low blood pressure.  Our evaluation did not reveal any lab abnormalities.  Your blood pressure is also pretty stable here. ? ?Were not quite sure why your blood pressure was read low at home.  We recommend that you hydrate well.  At any point if you start having dizziness, lightheadedness, severe chest pain or shortness of breath return to the ER immediately. ? ?If your blood pressures are labile, it could be because of Parkinson disease.  Please discuss this with your neurologist as soon as possible. ?

## 2021-07-05 ENCOUNTER — Other Ambulatory Visit: Payer: Self-pay

## 2021-07-05 ENCOUNTER — Ambulatory Visit
Admission: EM | Admit: 2021-07-05 | Discharge: 2021-07-05 | Disposition: A | Discharge status: Home / Self Care | Payer: Medicare Other | Attending: Urgent Care | Admitting: Urgent Care

## 2021-07-05 DIAGNOSIS — I959 Hypotension, unspecified: Secondary | ICD-10-CM

## 2021-07-05 DIAGNOSIS — Z20822 Contact with and (suspected) exposure to covid-19: Secondary | ICD-10-CM | POA: Diagnosis not present

## 2021-07-05 NOTE — ED Triage Notes (Signed)
Pt presents for COVID test, after caregiver tested positive for COVID (home test) today.  ? ?Per wife, pt is having on and off low blood pressure x 1 day.  ?

## 2021-07-05 NOTE — Discharge Instructions (Addendum)
Please make sure you follow-up with your regular doctor.  Your test results will auto populate to my chart in 48 to 72 hours.  If you test positive for COVID-19 I do recommend that you take COVID antiviral medications.  We are not drawing blood work as you are doctor has done this regularly and has access to your labs.  You can contact them to see if they are willing to prescribe you the medications we use it against COVID-19 should you test positive.  This is specifically Paxlovid or molnupiravir. ?

## 2021-07-05 NOTE — ED Provider Notes (Signed)
?[facility] ? ? ?MRN: [id number] DOB: [birth year] ? ?Subjective:  ? ?Gary Sellers is a 87 y.o. male presenting for COVID-19 testing.  This came about as the caregiver tested positive for COVID-19 using a home test today.  Per the patient and his wife there is no fever, cough, chest pain, shortness of breath, body aches, loss of taste and smell.  Patient was just seen through the emergency room for hypotension.  He was managed on 07/02/2021.  He does have a PCP that he follows up with regularly.  Has never had any issues with kidney disease.  Patient does have a history of Parkinson's disease, dementia, thyroid disease. ? ?No current facility-administered medications for this encounter. ? ?Current Outpatient Medications:  ?  anastrozole (ARIMIDEX) 1 MG tablet, Take by mouth., Disp: , Rfl:  ?  ascorbic acid (VITAMIN C) 500 MG tablet, Take 1 tablet by mouth 2 (two) times daily., Disp: , Rfl:  ?  carbidopa-levodopa (SINEMET IR) 25-100 MG tablet, Take 1 tablet by mouth every 3 (three) hours., Disp: , Rfl:  ?  Coenzyme Q10 10 MG capsule, Take 1 tablet by mouth daily. , Disp: , Rfl:  ?  DHEA 25 MG CAPS, Take 1 tablet by mouth daily., Disp: , Rfl:  ?  dorzolamide-timolol (COSOPT) 22.3-6.8 MG/ML ophthalmic solution, Place 1 drop into both eyes 2 (two) times daily. , Disp: , Rfl:  ?  ELIQUIS 5 MG TABS tablet, Take 1 tablet by mouth twice daily, Disp: 120 tablet, Rfl: 3 ?  enoxaparin (LOVENOX) 100 MG/ML injection, Inject 1 mL (100 mg total) into the skin daily., Disp: 3 mL, Rfl: 0 ?  Garlic 100 MG TABS, Take 1 tablet by mouth daily. , Disp: , Rfl:  ?  GLUTATHIONE PO, Take 1 tablet by mouth daily. , Disp: , Rfl:  ?  KRILL OIL PO, Take 1 tablet by mouth daily., Disp: , Rfl:  ?  Multiple Vitamins-Minerals (MULTIVITAMIN WITH MINERALS) tablet, Take 1 tablet by mouth daily., Disp: , Rfl:  ?  omeprazole (PRILOSEC) 40 MG capsule, Take 40 mg by mouth daily., Disp: , Rfl:  ?  polyethylene glycol (MIRALAX / GLYCOLAX)  packet, Take 17 g by mouth daily., Disp: 14 each, Rfl: 0 ?  tamsulosin (FLOMAX) 0.4 MG CAPS capsule, Take 0.4 mg by mouth 3 (three) times daily. Take two capsule three times daily, Disp: , Rfl:  ?  thyroid (ARMOUR) 30 MG tablet, Take 1 tablet by mouth daily. , Disp: , Rfl:  ?  TRAVATAN Z 0.004 % SOLN ophthalmic solution, Place 1 drop into both eyes at bedtime. , Disp: , Rfl:   ? ?Allergies  ?Allergen Reactions  ? Augmentin [Amoxicillin-Pot Clavulanate] Other (See Comments)  ?  Felt like he had tightness in his chest  ? ? ?Past Medical History:  ?Diagnosis Date  ? Dementia (HCC)   ? wife denies history of dementia.  ? GERD (gastroesophageal reflux disease)   ? Parkinson disease (HCC)   ? Thyroid disease   ?  ? ?Past Surgical History:  ?Procedure Laterality Date  ? DEEP BRAIN STIMULATOR PLACEMENT    ? PACEMAKER PLACEMENT    ? ? ?No family history on file. ? ?Social History  ? ?Tobacco Use  ? Smoking status: Never  ? Smokeless tobacco: Never  ?Vaping Use  ? Vaping Use: Never used  ?Substance Use Topics  ? Alcohol use: Never  ? Drug use: Never  ? ? ?ROS ? ? ?Objective:  ? ?Vitals: ?BP 101/68 (  BP Location: Right Arm)   Pulse 73   Temp (!) 97.5 ?F (36.4 ?C) (Oral)   Resp 18   SpO2 96%  ? ?Physical Exam ?Constitutional:   ?   General: He is not in acute distress. ?   Appearance: Normal appearance. He is well-developed and normal weight. He is not ill-appearing, toxic-appearing or diaphoretic.  ?HENT:  ?   Head: Normocephalic and atraumatic.  ?   Right Ear: External ear normal.  ?   Left Ear: External ear normal.  ?   Nose: Nose normal.  ?   Mouth/Throat:  ?   Mouth: Mucous membranes are moist.  ?Eyes:  ?   General: No scleral icterus.    ?   Right eye: No discharge.     ?   Left eye: No discharge.  ?   Extraocular Movements: Extraocular movements intact.  ?Cardiovascular:  ?   Rate and Rhythm: Normal rate and regular rhythm.  ?   Heart sounds: Normal heart sounds. No murmur heard. ?  No friction rub. No gallop.   ?Pulmonary:  ?   Effort: Pulmonary effort is normal. No respiratory distress.  ?   Breath sounds: Normal breath sounds. No stridor. No wheezing, rhonchi or rales.  ?Musculoskeletal:  ?   Cervical back: Normal range of motion.  ?Neurological:  ?   Mental Status: He is alert and oriented to person, place, and time.  ?Psychiatric:     ?   Mood and Affect: Mood normal.     ?   Behavior: Behavior normal.     ?   Thought Content: Thought content normal.     ?   Judgment: Judgment normal.  ? ? ?Assessment and Plan :  ? ?PDMP not reviewed this encounter. ? ?1. Hypotension, unspecified hypotension type   ?2. Exposure to COVID-19 virus   ? ? Patient has a reassuring physical exam. Hemodynamically stable vital signs. He would be a good candidate for COVID anti-viral medications.  They declined to do labs through our clinic as they normally get blood work done through their PCP.  They can follow-up with him closely and get prescriptions for Paxlovid ordered molnupiravir as appropriate by their PCP should her test come back positive.  Labs pending. ?  ?Wallis Bamberg, PA-C ?07/05/21 1324 ? ?

## 2021-07-07 LAB — NOVEL CORONAVIRUS, NAA: SARS-CoV-2, NAA: NOT DETECTED

## 2021-07-16 NOTE — Progress Notes (Signed)
?Cardiology Office Note:   ?Date:  07/17/2021  ?NAME:  Gary Sellers    ?MRN: [id number] ?DOB:  05-22-1934  ? ?PCP:  System, Provider Not In  ?Cardiologist:  None  ?Electrophysiologist:  None  ? ?Referring MD: Claudie Leach, PA-C  ? ?Chief Complaint  ?Patient presents with  ? Chest Pain  ? ?History of Present Illness:   ?Gary Sellers is a 87 y.o. male with a hx of dementia, parkinson's disease, PE who is being seen today for the evaluation of chest pain/abnormal EKG at the request of Loeffler, Finis Bud, PA-C. Seen in ER in December for chest pain. Troponin negative and referred for cardiology evaluation. ? ?He presents with his wife.  Apparently for the past year he has had tightness in his chest.  He tells me it occurs at night.  Described as worse with laying on his left side improved with laying on the other side.  Does not appear to be exertional.  No shortness of breath.  His wife informed me that he is stressed.  They apparently are moving to [facility].  This is some stress.  She reports she believes it is stress related.  He does not have any CV risk factors.  He is not high blood pressure.  No diabetes.  He did have a submassive pulmonary embolism in September 2021 remains on anticoagulation.  He has Parkinson disease with a deep brain stimulator.  No history of heart attack or stroke.  No history of tobacco abuse.  No alcohol or drug use.  He was seen in the emergency room in December.  He actually was seen in the ER in March.  EKG from 2 weeks ago demonstrates sinus rhythm with no acute ischemic changes or evidence of infarction.  No association with food.  No fevers or chills.  No cough or congestion.  No sick contacts. ? ?Problem List ?Parkinson's disease ?PE ?-submassive 12/2019 ? ?Past Medical History: ?Past Medical History:  ?Diagnosis Date  ? Dementia (HCC)   ? wife denies history of dementia.  ? GERD (gastroesophageal reflux disease)   ? Parkinson disease (HCC)   ? Thyroid disease    ? ? ?Past Surgical History: ?Past Surgical History:  ?Procedure Laterality Date  ? DEEP BRAIN STIMULATOR PLACEMENT    ? ? ?Current Medications: ?Current Meds  ?Medication Sig  ? anastrozole (ARIMIDEX) 1 MG tablet Take by mouth.  ? ascorbic acid (VITAMIN C) 500 MG tablet Take 1 tablet by mouth 2 (two) times daily.  ? carbidopa-levodopa (SINEMET IR) 25-100 MG tablet Take 1 tablet by mouth every 3 (three) hours.  ? Coenzyme Q10 10 MG capsule Take 1 tablet by mouth daily.   ? DHEA 25 MG CAPS Take 1 tablet by mouth daily.  ? dorzolamide-timolol (COSOPT) 22.3-6.8 MG/ML ophthalmic solution Place 1 drop into both eyes 2 (two) times daily.   ? ELIQUIS 5 MG TABS tablet Take 1 tablet by mouth twice daily  ? Garlic 100 MG TABS Take 1 tablet by mouth daily.   ? GLUTATHIONE PO Take 1 tablet by mouth daily.   ? KRILL OIL PO Take 1 tablet by mouth daily.  ? Multiple Vitamins-Minerals (MULTIVITAMIN WITH MINERALS) tablet Take 1 tablet by mouth daily.  ? omeprazole (PRILOSEC) 40 MG capsule Take 40 mg by mouth daily.  ? polyethylene glycol (MIRALAX / GLYCOLAX) packet Take 17 g by mouth daily.  ? tamsulosin (FLOMAX) 0.4 MG CAPS capsule Take 0.4 mg by mouth 3 (three) times daily. Take  two capsule three times daily  ? thyroid (ARMOUR) 30 MG tablet Take 1 tablet by mouth daily.   ? TRAVATAN Z 0.004 % SOLN ophthalmic solution Place 1 drop into both eyes at bedtime.   ?  ? ?Allergies:    ?Augmentin [amoxicillin-pot clavulanate]  ? ?Social History: ?Social History  ? ?Socioeconomic History  ? Marital status: Married  ?  Spouse name: Not on file  ? Number of children: 2  ? Years of education: Not on file  ? Highest education level: Not on file  ?Occupational History  ? Occupation: Retired Probation officerarming  ?Tobacco Use  ? Smoking status: Never  ? Smokeless tobacco: Never  ?Vaping Use  ? Vaping Use: Never used  ?Substance and Sexual Activity  ? Alcohol use: Never  ? Drug use: Never  ? Sexual activity: Not Currently  ?Other Topics Concern  ? Not on file   ?Social History Narrative  ? Not on file  ? ?Social Determinants of Health  ? ?Financial Resource Strain: Not on file  ?Food Insecurity: Not on file  ?Transportation Needs: Not on file  ?Physical Activity: Not on file  ?Stress: Not on file  ?Social Connections: Not on file  ?  ? ?Family History: ?The patient's family history is not on file. ? ?ROS:   ?All other ROS reviewed and negative. Pertinent positives noted in the HPI.    ? ?EKGs/Labs/Other Studies Reviewed:   ?The following studies were personally reviewed by me today: ? ?EKG:  EKG dated 07/02/2021 was reviewed in office.  This EKG demonstrates normal sinus rhythm heart rate 63 without acute ischemic changes or evidence of infarction. ? ?Recent Labs: ?07/02/2021: ALT 11; BUN 31; Creatinine, Ser 1.16; Hemoglobin 15.4; Platelets 236; Potassium 4.2; Sodium 135  ? ?Recent Lipid Panel ?No results found for: CHOL, TRIG, HDL, CHOLHDL, VLDL, LDLCALC, LDLDIRECT ? ?Physical Exam:   ?VS:  BP 118/72   Pulse 62   Ht 5\' 11"  (1.803 m)   Wt 154 lb (69.9 kg)   SpO2 97%   BMI 21.48 kg/m?    ?Wt Readings from Last 3 Encounters:  ?07/17/21 154 lb (69.9 kg)  ?07/02/21 155 lb (70.3 kg)  ?04/03/21 149 lb 14.6 oz (68 kg)  ?  ?General: Well nourished, well developed, in no acute distress ?Head: Atraumatic, normal size  ?Eyes: PEERLA, EOMI  ?Neck: Supple, no JVD ?Endocrine: No thryomegaly ?Cardiac: Normal S1, S2; RRR; no murmurs, rubs, or gallops ?Lungs: Clear to auscultation bilaterally, no wheezing, rhonchi or rales  ?Abd: Soft, nontender, no hepatomegaly  ?Ext: No edema, pulses 2+ ?Musculoskeletal: No deformities, BUE and BLE strength normal and equal ?Skin: Warm and dry, no rashes   ?Neuro: Alert and oriented to person, place, time, and situation, CNII-XII grossly intact, no focal deficits  ?Psych: Normal mood and affect  ? ?ASSESSMENT:   ?Gary Sellers is a 87 y.o. male who presents for the following: ?1. Precordial pain   ? ? ?PLAN:   ?1. Precordial pain ?-He presents with  noncardiac chest pain.  Described as tightness when laying on his left side.  Symptoms are improving with laying on the opposite side.  Recent EKG from the emergency room demonstrate sinus rhythm with no acute ischemic changes.  Symptoms are nonexertional.  No associated shortness of breath.  He really has no real cardiovascular risk factors.  Medical history is really significant for advanced Parkinson disease with a deep brain stimulator. ?-Given symptoms are not concerning we discussed stress testing versus watchful waiting.  He and his wife have opted for no further testing.  We can keep an eye on this.  Stress could be contributing.  He will see [place] back as needed. ? ?Disposition: Return if symptoms worsen or fail to improve. ? ?Medication Adjustments/Labs and Tests Ordered: ?Current medicines are reviewed at length with the patient today.  Concerns regarding medicines are outlined above.  ?No orders of the defined types were placed in this encounter. ? ?No orders of the defined types were placed in this encounter. ? ? ?Patient Instructions  ?Medication Instructions:  ?Your physician recommends that you continue on your current medications as directed. Please refer to the Current Medication list given to you today. ? ? ?Labwork: ?None today ? ?Testing/Procedures: ?None today ? ?Follow-Up: ?As needed ? ?Any Other Special Instructions Will Be Listed Below (If Applicable). ? ?If you need a refill on your cardiac medications before your next appointment, please call your pharmacy. ?  ?Signed, ?Gerri Spore T. Flora Lipps, MD, [facility] ?[place]  ?[address], [address] ?[place], [place] [address] ?[phone]  ?07/17/2021 4:24 PM    ? ?

## 2021-07-17 ENCOUNTER — Ambulatory Visit (INDEPENDENT_AMBULATORY_CARE_PROVIDER_SITE_OTHER): Payer: Medicare Other | Admitting: Cardiovascular Disease

## 2021-07-17 ENCOUNTER — Encounter: Payer: Self-pay | Admitting: Cardiovascular Disease

## 2021-07-17 VITALS — BP 118/72 | HR 62 | Ht 71.0 in | Wt 154.0 lb

## 2021-07-17 DIAGNOSIS — R072 Precordial pain: Secondary | ICD-10-CM

## 2021-07-17 NOTE — Patient Instructions (Signed)
Medication Instructions:  Your physician recommends that you continue on your current medications as directed. Please refer to the Current Medication list given to you today.   Labwork: None today  Testing/Procedures: None today  Follow-Up: As needed  Any Other Special Instructions Will Be Listed Below (If Applicable).  If you need a refill on your cardiac medications before your next appointment, please call your pharmacy.  

## 2021-08-09 ENCOUNTER — Other Ambulatory Visit: Payer: Self-pay | Admitting: Urology

## 2021-08-09 DIAGNOSIS — N281 Cyst of kidney, acquired: Secondary | ICD-10-CM

## 2021-08-21 ENCOUNTER — Inpatient Hospital Stay (HOSPITAL_COMMUNITY): Payer: Medicare Other | Attending: Hematology

## 2021-08-21 DIAGNOSIS — I2699 Other pulmonary embolism without acute cor pulmonale: Secondary | ICD-10-CM

## 2021-08-21 DIAGNOSIS — G20 Parkinson's disease: Secondary | ICD-10-CM | POA: Insufficient documentation

## 2021-08-21 DIAGNOSIS — F0280 Dementia in other diseases classified elsewhere without behavioral disturbance: Secondary | ICD-10-CM | POA: Insufficient documentation

## 2021-08-21 DIAGNOSIS — Z86718 Personal history of other venous thrombosis and embolism: Secondary | ICD-10-CM | POA: Diagnosis present

## 2021-08-21 DIAGNOSIS — Z7901 Long term (current) use of anticoagulants: Secondary | ICD-10-CM | POA: Diagnosis not present

## 2021-08-21 DIAGNOSIS — Z86711 Personal history of pulmonary embolism: Secondary | ICD-10-CM | POA: Diagnosis not present

## 2021-08-21 LAB — D-DIMER, QUANTITATIVE: D-Dimer, Quant: 0.32 ug/mL-FEU (ref 0.00–0.50)

## 2021-08-27 NOTE — Progress Notes (Signed)
? ?[facility] ?[address] ?[place], [place] [address] ? ? ?CLINIC:  ?Medical Oncology/Hematology ? ?PCP:  ?System, Provider Not In ?No address on file ?None ? ? ?REASON FOR VISIT:  ?Follow-up for PE x2 (2014 & 2021) ?  ?PRIOR THERAPY: Eliquis for 6 months in 01/2013 ?  ?CURRENT THERAPY: Eliquis 5 mg BID ? ?INTERVAL HISTORY:  ?Gary Sellers 87 y.o. male returns for routine follow-up of his history of recurrent unprovoked pulmonary embolisms.  He was last seen by Dr. Delton Coombes on 02/13/2021. ? ?At today's visit, he reports feeling fair.  No recent hospitalizations, surgeries, or changes in baseline health status. ? ?He continues to take Eliquis as prescribed.  He denies any bright red blood per rectum or melena. ?He does have occasional falls at home, about once every 2 to 3 months.  He reports that he fell yesterday and hit his head, but is feeling well today.  No current symptoms concerning for recurrent DVT or PE. ? ?He has 60% energy and 100% appetite. He endorses that he is maintaining a stable weight. ? ? ?REVIEW OF SYSTEMS:  ?Review of Systems  ?Constitutional:  Positive for fatigue. Negative for appetite change, chills, diaphoresis, fever and unexpected weight change.  ?HENT:   Negative for lump/mass and nosebleeds.   ?Eyes:  Negative for eye problems.  ?Respiratory:  Positive for chest tightness. Negative for cough, hemoptysis and shortness of breath.   ?Cardiovascular:  Positive for chest pain (heartburn). Negative for leg swelling and palpitations.  ?Gastrointestinal:  Positive for nausea. Negative for abdominal pain, blood in stool, constipation, diarrhea and vomiting.  ?Genitourinary:  Negative for hematuria.   ?Musculoskeletal:  Positive for gait problem.  ?Skin: Negative.   ?Neurological:  Positive for dizziness, gait problem and numbness. Negative for headaches and light-headedness.  ?Hematological:  Does not bruise/bleed easily.   ? ? ?PAST MEDICAL/SURGICAL HISTORY:  ?Past Medical History:   ?Diagnosis Date  ? Dementia ([place])   ? wife denies history of dementia.  ? GERD (gastroesophageal reflux disease)   ? Parkinson disease ([place])   ? Thyroid disease   ? ?Past Surgical History:  ?Procedure Laterality Date  ? DEEP BRAIN STIMULATOR PLACEMENT    ? ? ? ?SOCIAL HISTORY:  ?Social History  ? ?Socioeconomic History  ? Marital status: Married  ?  Spouse name: Not on file  ? Number of children: 2  ? Years of education: Not on file  ? Highest education level: Not on file  ?Occupational History  ? Occupation: Retired Designer, television/film set  ?Tobacco Use  ? Smoking status: Never  ? Smokeless tobacco: Never  ?Vaping Use  ? Vaping Use: Never used  ?Substance and Sexual Activity  ? Alcohol use: Never  ? Drug use: Never  ? Sexual activity: Not Currently  ?Other Topics Concern  ? Not on file  ?Social History Narrative  ? Not on file  ? ?Social Determinants of Health  ? ?Financial Resource Strain: Not on file  ?Food Insecurity: Not on file  ?Transportation Needs: Not on file  ?Physical Activity: Not on file  ?Stress: Not on file  ?Social Connections: Not on file  ?Intimate Partner Violence: Not on file  ? ? ?FAMILY HISTORY:  ?No family history on file. ? ?CURRENT MEDICATIONS:  ?Outpatient Encounter Medications as of 08/28/2021  ?Medication Sig  ? anastrozole (ARIMIDEX) 1 MG tablet Take by mouth.  ? ascorbic acid (VITAMIN C) 500 MG tablet Take 1 tablet by mouth 2 (two) times daily.  ?  carbidopa-levodopa (SINEMET IR) 25-100 MG tablet Take 1 tablet by mouth every 3 (three) hours.  ? Coenzyme Q10 10 MG capsule Take 1 tablet by mouth daily.   ? DHEA 25 MG CAPS Take 1 tablet by mouth daily.  ? dorzolamide-timolol (COSOPT) 22.3-6.8 MG/ML ophthalmic solution Place 1 drop into both eyes 2 (two) times daily.   ? ELIQUIS 5 MG TABS tablet Take 1 tablet by mouth twice daily  ? enoxaparin (LOVENOX) 100 MG/ML injection Inject 1 mL (100 mg total) into the skin daily. (Patient not taking: Reported on 07/17/2021)  ? Garlic 123XX123 MG TABS Take 1 tablet by  mouth daily.   ? GLUTATHIONE PO Take 1 tablet by mouth daily.   ? KRILL OIL PO Take 1 tablet by mouth daily.  ? Multiple Vitamins-Minerals (MULTIVITAMIN WITH MINERALS) tablet Take 1 tablet by mouth daily.  ? omeprazole (PRILOSEC) 40 MG capsule Take 40 mg by mouth daily.  ? polyethylene glycol (MIRALAX / GLYCOLAX) packet Take 17 g by mouth daily.  ? tamsulosin (FLOMAX) 0.4 MG CAPS capsule Take 0.4 mg by mouth 3 (three) times daily. Take two capsule three times daily  ? thyroid (ARMOUR) 30 MG tablet Take 1 tablet by mouth daily.   ? TRAVATAN Z 0.004 % SOLN ophthalmic solution Place 1 drop into both eyes at bedtime.   ? ?No facility-administered encounter medications on file as of 08/28/2021.  ? ? ?ALLERGIES:  ?Allergies  ?Allergen Reactions  ? Augmentin [Amoxicillin-Pot Clavulanate] Other (See Comments)  ?  Felt like he had tightness in his chest  ? ? ? ?PHYSICAL EXAM:  ?ECOG PERFORMANCE STATUS: 2 - Symptomatic, <50% confined to bed ? ?There were no vitals filed for this visit. ?There were no vitals filed for this visit. ?Physical Exam ?Vitals reviewed.  ?Constitutional:   ?   Appearance: Normal appearance.  ?   Comments: Ambulates with walker  ?Cardiovascular:  ?   Rate and Rhythm: Normal rate and regular rhythm.  ?   Pulses: Normal pulses.  ?   Heart sounds: Normal heart sounds.  ?Pulmonary:  ?   Effort: Pulmonary effort is normal.  ?   Breath sounds: Normal breath sounds.  ?Neurological:  ?   General: No focal deficit present.  ?   Mental Status: He is alert and oriented to person, place, and time.  ?Psychiatric:     ?   Mood and Affect: Mood normal.     ?   Behavior: Behavior normal.  ? ? ? ?LABORATORY DATA:  ?I have reviewed the labs as listed.  ?CBC ?   ?Component Value Date/Time  ? WBC 7.2 07/02/2021 1311  ? RBC 4.86 07/02/2021 1311  ? HGB 15.4 07/02/2021 1311  ? HCT 46.5 07/02/2021 1311  ? PLT 236 07/02/2021 1311  ? MCV 95.7 07/02/2021 1311  ? MCH 31.7 07/02/2021 1311  ? MCHC 33.1 07/02/2021 1311  ? RDW 12.8  07/02/2021 1311  ? LYMPHSABS 1.1 07/02/2021 1311  ? MONOABS 0.6 07/02/2021 1311  ? EOSABS 0.5 07/02/2021 1311  ? BASOSABS 0.1 07/02/2021 1311  ? ? ?  Latest Ref Rng & Units 07/02/2021  ?  1:11 PM 04/03/2021  ? 12:57 PM 06/07/2020  ?  7:58 PM  ?CMP  ?Glucose 70 - 99 mg/dL 94   101   101    ?BUN 8 - 23 mg/dL 31   34   31    ?Creatinine 0.61 - 1.24 mg/dL 1.16   1.21   1.15    ?  Sodium 135 - 145 mmol/L 135   136   134    ?Potassium 3.5 - 5.1 mmol/L 4.2   4.6   4.2    ?Chloride 98 - 111 mmol/L 101   103   104    ?CO2 22 - 32 mmol/L 25   26   24     ?Calcium 8.9 - 10.3 mg/dL 8.9   9.3   8.7    ?Total Protein 6.5 - 8.1 g/dL 5.9    6.0    ?Total Bilirubin 0.3 - 1.2 mg/dL 0.6    0.6    ?Alkaline Phos 38 - 126 U/L 85    63    ?AST 15 - 41 U/L 18    19    ?ALT 0 - 44 U/L 11    6    ? ? ?DIAGNOSTIC IMAGING:  ?I have independently reviewed the relevant imaging and discussed with the patient. ? ?ASSESSMENT & PLAN: ?1.  Recurrent unprovoked pulmonary embolism: ?- He had DVT/PE in the leg few years ago (October 2014) and was treated with 6 months of anticoagulation.  (Precipitating circumstances unknown.  Patient is poor historian.) ?- CT angio on 12/28/2019 shows acute bilateral pulmonary emboli with CT evidence of right heart strain. ?- Patient was not severely immobile although his mobility is slightly limited by Parkinson's disease. ?- Reviewed labs from recent hospitalization from 01/01/2020.  Factor V Leiden, prothrombin gene mutation, lupus anticoagulant, beta-2 glycoprotein 1 antibody, anticardiolipin antibody were negative.  Protein C&S levels were normal.  Antithrombin III was normal. ?- He is continuing Eliquis twice daily.  No bright red blood per rectum or melena. ?- Fell yesterday and hit his head, feels well today.  Reports that he falls once every 2 to 3 months. ?- Most recent D-dimer (08/21/2021) normal at 0.32 ?- As he had unprovoked to weakly provoked pulmonary embolism with right heart strain in 2021, we have recommended  indefinite anticoagulation at this time, especially in light of his prior DVT/PE in 2014. ?- Discussed extensively with patient and his wife that risks of continuing anticoagulation include possibility of

## 2021-08-28 ENCOUNTER — Ambulatory Visit (HOSPITAL_COMMUNITY): Payer: PRIVATE HEALTH INSURANCE | Admitting: Hematology

## 2021-08-28 ENCOUNTER — Inpatient Hospital Stay (HOSPITAL_BASED_OUTPATIENT_CLINIC_OR_DEPARTMENT_OTHER): Payer: Medicare Other | Admitting: Physician Assistant

## 2021-08-28 VITALS — BP 101/62 | HR 65 | Temp 97.6°F | Resp 16 | Ht 71.0 in | Wt 151.9 lb

## 2021-08-28 DIAGNOSIS — I2699 Other pulmonary embolism without acute cor pulmonale: Secondary | ICD-10-CM

## 2021-08-28 DIAGNOSIS — Z86711 Personal history of pulmonary embolism: Secondary | ICD-10-CM | POA: Diagnosis not present

## 2021-08-28 NOTE — Patient Instructions (Signed)
[place] at [facility] ?Discharge Instructions ? ?You were seen today by Tarri Abernethy PA-C for your history of blood clots. ? ?You are currently on Eliquis, which is a blood thinner that will help prevent any further blood clots.  However, Eliquis also increases your risks of bleeding.  After our in-depth discussion of risks and benefits of blood thinners, you expressed that you would prefer to stay on the blood thinner for now.  This is reasonable, but if you start to have more frequent falls at home, I would recommend that the Eliquis be stopped.  We will see you again in 6 months to reassess ongoing risks and benefits of blood thinners. ? ?Seek immediate medical attention if you have any signs of bleeding.  You should also seek medical attention after any falls at home, especially if you hit your head. ? ?FOLLOW-UP APPOINTMENT: Labs and office visit in 6 months ? ? ?Thank you for choosing [place] at [facility] to provide your oncology and hematology care.  To afford each patient quality time with our provider, please arrive at least 15 minutes before your scheduled appointment time.  ? ?If you have a lab appointment with the [facility] please come in thru the Main Entrance and check in at the main information desk. ? ?You need to re-schedule your appointment should you arrive 10 or more minutes late.  We strive to give you quality time with our providers, and arriving late affects you and other patients whose appointments are after yours.  Also, if you no show three or more times for appointments you may be dismissed from the clinic at the providers discretion.     ?Again, thank you for choosing [facility].  Our hope is that these requests will decrease the amount of time that you wait before being seen by our physicians.       ?_____________________________________________________________ ? ?Should you have questions after your visit to  [facility], please contact our office at [phone] and follow the prompts.  Our office hours are 8:00 a.m. and 4:30 p.m. Monday - Friday.  Please note that voicemails left after 4:00 p.m. may not be returned until the following business day.  We are closed weekends and major holidays.  You do have access to a nurse 24-7, just call the main number to the clinic [phone] and do not press any options, hold on the line and a nurse will answer the phone.   ? ?For prescription refill requests, have your pharmacy contact our office and allow 72 hours.   ? ?Due to Covid, you will need to wear a mask upon entering the hospital. If you do not have a mask, a mask will be given to you at the Main Entrance upon arrival. For doctor visits, patients may have 1 support person age 85 or older with them. For treatment visits, patients can not have anyone with them due to social distancing guidelines and our immunocompromised population.  ? ? ? ?

## 2021-08-30 ENCOUNTER — Ambulatory Visit
Admission: RE | Admit: 2021-08-30 | Discharge: 2021-08-30 | Disposition: A | Discharge status: Home / Self Care | Payer: Medicare Other | Source: Ambulatory Visit | Attending: Urology | Admitting: Urology

## 2021-08-30 DIAGNOSIS — N281 Cyst of kidney, acquired: Secondary | ICD-10-CM

## 2021-09-02 ENCOUNTER — Emergency Department (HOSPITAL_COMMUNITY)
Admission: EM | Admit: 2021-09-02 | Discharge: 2021-09-02 | Disposition: A | Discharge status: Home / Self Care | Payer: Medicare Other | Attending: Emergency Medicine | Admitting: Emergency Medicine

## 2021-09-02 ENCOUNTER — Emergency Department (HOSPITAL_COMMUNITY): Payer: Medicare Other

## 2021-09-02 ENCOUNTER — Encounter (HOSPITAL_COMMUNITY): Payer: Self-pay

## 2021-09-02 ENCOUNTER — Other Ambulatory Visit: Payer: Self-pay

## 2021-09-02 DIAGNOSIS — W19XXXA Unspecified fall, initial encounter: Secondary | ICD-10-CM

## 2021-09-02 DIAGNOSIS — Z7901 Long term (current) use of anticoagulants: Secondary | ICD-10-CM | POA: Insufficient documentation

## 2021-09-02 DIAGNOSIS — S3992XA Unspecified injury of lower back, initial encounter: Secondary | ICD-10-CM | POA: Diagnosis present

## 2021-09-02 DIAGNOSIS — S300XXA Contusion of lower back and pelvis, initial encounter: Secondary | ICD-10-CM | POA: Diagnosis not present

## 2021-09-02 DIAGNOSIS — G20 Parkinson's disease: Secondary | ICD-10-CM | POA: Insufficient documentation

## 2021-09-02 DIAGNOSIS — W01198A Fall on same level from slipping, tripping and stumbling with subsequent striking against other object, initial encounter: Secondary | ICD-10-CM | POA: Insufficient documentation

## 2021-09-02 NOTE — ED Provider Triage Note (Signed)
Emergency Medicine Provider Triage Evaluation Note ? ?Gary Sellers , a 87 y.o. male  was evaluated in triage.  Pt complains of back pain after falling ? ?Review of Systems  ?Positive: Pain  ?Negative:  ? ?Physical Exam  ?BP 100/65 (BP Location: Right Arm)   Pulse 80   Temp (!) 97.5 ?F (36.4 ?C) (Oral)   Resp 17   Ht 5\' 11"  (1.803 m)   Wt 68.5 kg   SpO2 97%   BMI 21.06 kg/m?  ?Gen:   Awake, no distress   ?Resp:  Normal effort  ?MSK:   Pain with movement  ?Other:   ? ?Medical Decision Making  ?Medically screening exam initiated at 1:28 PM.  Appropriate orders placed.  Gary Sellers was informed that the remainder of the evaluation will be completed by another provider, this initial triage assessment does not replace that evaluation, and the importance of remaining in the ED until their evaluation is complete. ? ? ?  ?[name], [place] Areas ?09/02/21 1328 ? ?

## 2021-09-02 NOTE — ED Provider Notes (Signed)
?[facility] ?Provider Note ? ? ?CSN: [id number] ?Arrival date & time: 09/02/21  1046 ? ?  ? ?History ? ?Chief Complaint  ?Patient presents with  ? Fall  ? ? ?Gary Sellers is a 87 y.o. male. ? ?Patient's wife reports patient fell yesterday and hit his low back patient has continued to complain of pain today.  Patient reports pain with moving.  Patient's family reports that they witnessed fall.  Patient did not hit his head he did not have a loss of consciousness.  Family reports patient fell because he is unstable secondary to Parkinson's.  Patient has been acting normally other than pain.  ? ?The history is provided by the patient. No language interpreter was used.  ?Fall ?This is a new problem. The current episode started yesterday. The problem occurs constantly. The problem has not changed since onset.Pertinent negatives include no chest pain, no abdominal pain, no headaches and no shortness of breath. Nothing aggravates the symptoms. Nothing relieves the symptoms. He has tried nothing for the symptoms. The treatment provided no relief.  ? ?  ? ?Home Medications ?Prior to Admission medications   ?Medication Sig Start Date End Date Taking? Authorizing Provider  ?anastrozole (ARIMIDEX) 1 MG tablet Take by mouth. 01/26/21   [provider]  ?ascorbic acid (VITAMIN C) 500 MG tablet Take 1 tablet by mouth 2 (two) times daily.    [provider]  ?carbidopa-levodopa (SINEMET IR) 25-100 MG tablet Take 1 tablet by mouth every 3 (three) hours. 12/09/19   [provider]  ?Coenzyme Q10 10 MG capsule Take 1 tablet by mouth daily.     [provider]  ?DHEA 25 MG CAPS Take 1 tablet by mouth daily.    [provider]  ?donepezil (ARICEPT ODT) 5 MG disintegrating tablet Take by mouth. 08/16/21   [provider]  ?dorzolamide-timolol (COSOPT) 22.3-6.8 MG/ML ophthalmic solution Place 1 drop into both eyes 2 (two) times daily.  11/25/12   [provider]  ?Everlene BallsELIQUIS 5 MG TABS tablet Take 1 tablet by mouth twice daily 04/15/21   Doreatha MassedKatragadda, Sreedhar, MD  ?enoxaparin (LOVENOX) 100 MG/ML injection Inject 1 mL (100 mg total) into the skin daily. 02/06/20   Doreatha MassedKatragadda, Sreedhar, MD  ?Garlic 100 MG TABS Take 1 tablet by mouth daily.     [provider]  ?GLUTATHIONE PO Take 1 tablet by mouth daily.     [provider]  ?KRILL OIL PO Take 1 tablet by mouth daily.    [provider]  ?latanoprost (XALATAN) 0.005 % ophthalmic solution  08/25/21   [provider]  ?Multiple Vitamins-Minerals (MULTIVITAMIN WITH MINERALS) tablet Take 1 tablet by mouth daily.    [provider]  ?omeprazole (PRILOSEC) 40 MG capsule Take 40 mg by mouth daily. 01/26/13   [provider]  ?polyethylene glycol (MIRALAX / GLYCOLAX) packet Take 17 g by mouth daily. 01/07/18   Mesner, Barbara CowerJason, MD  ?tamsulosin (FLOMAX) 0.4 MG CAPS capsule Take 0.4 mg by mouth 3 (three) times daily. Take two capsule three times daily    [provider]  ?testosterone cypionate (DEPOTESTOSTERONE CYPIONATE) 200 MG/ML injection Inject into the muscle. 08/14/21   [provider]  ?thyroid (ARMOUR) 30 MG tablet Take 1 tablet by mouth daily.     [provider]  ?TRAVATAN Z 0.004 % SOLN ophthalmic solution Place 1 drop into both eyes at bedtime.  11/25/12   [provider]  ?   ? ?Allergies    ?  Augmentin [amoxicillin-pot clavulanate]   ? ?Review of Systems   ?Review of Systems  ?Respiratory:  Negative for shortness of breath.   ?Cardiovascular:  Negative for chest pain.  ?Gastrointestinal:  Negative for abdominal pain.  ?Neurological:  Negative for headaches.  ?All other systems reviewed and are negative. ? ?Physical Exam ?Updated Vital Signs ?BP 131/82   Pulse 60   Temp (!) 97.5 ?F (36.4 ?C) (Oral)   Resp 13   Ht 5\' 11"  (1.803 m)   Wt 68.5 kg   SpO2 96%   BMI 21.06 kg/m?  ?Physical Exam ?Vitals and nursing note reviewed.  ?Constitutional:    ?   General: He is not in acute distress. ?   Appearance: He is well-developed.  ?HENT:  ?   Head: Normocephalic and atraumatic.  ?   Comments: Patient has no evidence of bruising or injury to his face or head ?Eyes:  ?   Conjunctiva/sclera: Conjunctivae normal.  ?Neck:  ?   Comments: Cervical spine is nontender patient has good range of motion ?Cardiovascular:  ?   Rate and Rhythm: Normal rate and regular rhythm.  ?   Heart sounds: No murmur heard. ?Pulmonary:  ?   Effort: Pulmonary effort is normal. No respiratory distress.  ?   Breath sounds: Normal breath sounds.  ?Abdominal:  ?   Palpations: Abdomen is soft.  ?   Tenderness: There is no abdominal tenderness.  ?Musculoskeletal:     ?   General: Tenderness present. No deformity. Normal range of motion.  ?   Cervical back: Neck supple.  ?   Comments: Patient has a diffusely tender lower thoracic to lower lumbar spine.  No bruising no erythema  ?Skin: ?   General: Skin is warm and dry.  ?   Capillary Refill: Capillary refill takes less than 2 seconds.  ?Neurological:  ?   General: No focal deficit present.  ?   Mental Status: He is alert and oriented to person, place, and time.  ?Psychiatric:     ?   Mood and Affect: Mood normal.  ? ? ?ED Results / Procedures / Treatments   ?Labs ?(all labs ordered are listed, but only abnormal results are displayed) ?Labs Reviewed - No data to display ? ?EKG ?None ? ?Radiology ?DG Thoracic Spine 2 View ? ?Result Date: 09/02/2021 ?CLINICAL DATA:  Fall with back pain. EXAM: THORACIC SPINE 2 VIEWS COMPARISON:  April 03, 2021. FINDINGS: Images limited due to patient condition by report. Osteopenia further limiting assessment. Cervicothoracic junction not well assessed despite attempts to image this area. Nerve stimulator projecting over LEFT chest tracking towards LEFT neck. No sign of spinal malalignment. No visible fracture within the limitations of the current exam. IMPRESSION: Limited evaluation due to patient condition by  report. No visible fracture or sign of static subluxation. Cervicothoracic junction not well assessed. If there is high clinical suspicion for fracture could consider evaluation with cross-sectional imaging. Electronically Signed   By: April 05, 2021 M.D.   On: 09/02/2021 14:06  ? ?DG Lumbar Spine Complete ? ?Result Date: 09/02/2021 ?CLINICAL DATA:  Back pain following fall. EXAM: LUMBAR SPINE - COMPLETE 4+ VIEW COMPARISON:  Thoracic spine evaluation of the same date. FINDINGS: Five lumbar type vertebral bodies. No sign of fracture or static subluxation. Mark disc space narrowing noted at L3-4, L4-5 and L5-S1. Facet arthropathy. This is also greatest in the lower lumbar spine. No visible fracture. Osteopenia. IMPRESSION: No sign of fracture or static subluxation. Disc space narrowing and  facet arthropathy greatest in the lower lumbar spine. Electronically Signed   By: Donzetta Kohut M.D.   On: 09/02/2021 14:08   ? ?Procedures ?Procedures  ? ? ?Medications Ordered in ED ?Medications - No data to display ? ?ED Course/ Medical Decision Making/ A&P ?Clinical Course as of 09/02/21 2003  ?Mon Sep 02, 2021  ?1519 DG Lumbar Spine Complete [RR]  ?  ?Clinical Course User Index ?[RR] Achille Rich, PA-C  ? ?                        ?Medical Decision Making ?Patient had a fall yesterday at home and hit his low back.  Patient did not strike his head he did not have a loss of consciousness ? ?Amount and/or Complexity of Data Reviewed ?Independent Historian: spouse ?   Details: Patient's wife reports she observed the fall ?External Data Reviewed: notes. ?   Details: Patient is followed by neurology notes reviewed ?Radiology: ordered and independent interpretation performed. Decision-making details documented in ED Course. ?   Details: X-ray thoracic and lumbar spine show no evidence of fracture ? ?Risk ?Risk Details: Patient was seen by me initially at triage for screening.  Patient fully undressed and examined and he has no  evidence of bruising.  Patient was able to stand and ambulate with a walker.  I discussed x-rays with patient and family.  I do not see any evidence of fracture.  Advised them to return to the emergency [facility] if

## 2021-09-02 NOTE — ED Triage Notes (Signed)
Pt fell yesterday when he got up from table and fell onto a wall and hurt his back. Pt is on Eliquis. No head injury.  ?

## 2021-09-02 NOTE — Discharge Instructions (Addendum)
Tylenol every 4 hours for pain.  Return if any problems.   ?

## 2021-09-02 NOTE — ED Notes (Signed)
Patient ambulated with walker.  

## 2021-09-06 ENCOUNTER — Encounter (HOSPITAL_COMMUNITY): Payer: Self-pay

## 2021-09-13 ENCOUNTER — Ambulatory Visit
Admission: RE | Admit: 2021-09-13 | Discharge: 2021-09-13 | Disposition: A | Discharge status: Home / Self Care | Payer: Medicare Other | Source: Ambulatory Visit | Attending: Urology | Admitting: Urology

## 2021-09-13 MED ORDER — GADOBENATE DIMEGLUMINE 529 MG/ML IV SOLN
14.0000 mL | Freq: Once | INTRAVENOUS | Status: AC | PRN
  Administered 2021-09-13: 14 mL via INTRAVENOUS

## 2021-10-07 ENCOUNTER — Other Ambulatory Visit: Payer: Self-pay | Admitting: Family Medicine

## 2021-10-07 DIAGNOSIS — S32000A Wedge compression fracture of unspecified lumbar vertebra, initial encounter for closed fracture: Secondary | ICD-10-CM

## 2021-10-07 DIAGNOSIS — W19XXXA Unspecified fall, initial encounter: Secondary | ICD-10-CM

## 2021-10-11 ENCOUNTER — Encounter (HOSPITAL_COMMUNITY): Payer: Self-pay

## 2021-10-11 ENCOUNTER — Emergency Department (HOSPITAL_COMMUNITY)
Admission: EM | Admit: 2021-10-11 | Discharge: 2021-10-11 | Disposition: A | Discharge status: Home / Self Care | Payer: Medicare Other | Attending: Emergency Medicine | Admitting: Emergency Medicine

## 2021-10-11 ENCOUNTER — Other Ambulatory Visit: Payer: Self-pay

## 2021-10-11 DIAGNOSIS — R031 Nonspecific low blood-pressure reading: Secondary | ICD-10-CM | POA: Diagnosis present

## 2021-10-11 DIAGNOSIS — R7989 Other specified abnormal findings of blood chemistry: Secondary | ICD-10-CM | POA: Insufficient documentation

## 2021-10-11 DIAGNOSIS — F0390 Unspecified dementia without behavioral disturbance: Secondary | ICD-10-CM | POA: Insufficient documentation

## 2021-10-11 DIAGNOSIS — Z7901 Long term (current) use of anticoagulants: Secondary | ICD-10-CM | POA: Diagnosis not present

## 2021-10-11 DIAGNOSIS — G20 Parkinson's disease: Secondary | ICD-10-CM | POA: Insufficient documentation

## 2021-10-11 LAB — CBC
HCT: 44.3 % (ref 39.0–52.0)
Hemoglobin: 14.5 g/dL (ref 13.0–17.0)
MCH: 30.9 pg (ref 26.0–34.0)
MCHC: 32.7 g/dL (ref 30.0–36.0)
MCV: 94.5 fL (ref 80.0–100.0)
Platelets: 215 10*3/uL (ref 150–400)
RBC: 4.69 MIL/uL (ref 4.22–5.81)
RDW: 13 % (ref 11.5–15.5)
WBC: 7.3 10*3/uL (ref 4.0–10.5)
nRBC: 0 % (ref 0.0–0.2)

## 2021-10-11 LAB — BASIC METABOLIC PANEL
Anion gap: 7 (ref 5–15)
BUN: 28 mg/dL — ABNORMAL HIGH (ref 8–23)
CO2: 26 mmol/L (ref 22–32)
Calcium: 9.4 mg/dL (ref 8.9–10.3)
Chloride: 102 mmol/L (ref 98–111)
Creatinine, Ser: 1.5 mg/dL — ABNORMAL HIGH (ref 0.61–1.24)
GFR, Estimated: 45 mL/min — ABNORMAL LOW (ref >60)
Glucose, Bld: 103 mg/dL — ABNORMAL HIGH (ref 70–99)
Potassium: 4.5 mmol/L (ref 3.5–5.1)
Sodium: 135 mmol/L (ref 135–145)

## 2021-10-11 LAB — URINALYSIS, ROUTINE W REFLEX MICROSCOPIC
Bilirubin Urine: NEGATIVE
Glucose, UA: NEGATIVE mg/dL
Hgb urine dipstick: NEGATIVE
Ketones, ur: 5 mg/dL — AB
Leukocytes,Ua: NEGATIVE
Nitrite: NEGATIVE
Protein, ur: NEGATIVE mg/dL
Specific Gravity, Urine: 1.013 (ref 1.005–1.030)
pH: 7 (ref 5.0–8.0)

## 2021-10-11 LAB — CBG MONITORING, ED: Glucose-Capillary: 96 mg/dL (ref 70–99)

## 2021-10-11 MED ORDER — SODIUM CHLORIDE 0.9 % IV BOLUS
1000.0000 mL | Freq: Once | INTRAVENOUS | Status: AC
  Administered 2021-10-11: 1000 mL via INTRAVENOUS

## 2021-10-11 NOTE — ED Provider Notes (Signed)
[facility] EMERGENCY DEPARTMENT Provider Note   CSN: [id number] Arrival date & time: 10/11/21  1223     History  Chief Complaint  Patient presents with   Hypotension    Gary Sellers is a 87 y.o. male.  HPI  Patient has a history of Parkinson's disease, dementia, GERD, prior PE who presents to the ED for evaluation of low blood pressure.  Patient has been having some issues with his blood pressure running low for the last several weeks.  Patient has not had any trouble with any chest pain.  No fevers.  No vomiting or diarrhea.  Family measured blood pressures as low as in the 70s a few days ago.  Today however his blood pressure has been running normally.  Previously they presumed it was related to his Parkinson's.  Family spoke with his doctor who suggested he come to the ED for evaluation.  Today the patient has no complaints and his blood pressure has been running normal.  Home Medications Prior to Admission medications   Medication Sig Start Date End Date Taking? Authorizing Provider  carbidopa-levodopa (SINEMET IR) 25-100 MG tablet Take 1 tablet by mouth every 3 (three) hours. 12/09/19  Yes [provider]  donepezil (ARICEPT ODT) 5 MG disintegrating tablet Take 5 mg by mouth at bedtime. 08/16/21  Yes [provider]  dorzolamide-timolol (COSOPT) 22.3-6.8 MG/ML ophthalmic solution Place 1 drop into both eyes 2 (two) times daily.  11/25/12  Yes [provider]  ELIQUIS 5 MG TABS tablet Take 1 tablet by mouth twice daily 04/15/21  Yes Doreatha Massed, MD  latanoprost (XALATAN) 0.005 % ophthalmic solution Place 1 drop into both eyes at bedtime. 08/25/21  Yes [provider]  Multiple Vitamins-Minerals (MULTIVITAMIN WITH MINERALS) tablet Take 1 tablet by mouth daily.   Yes [provider]  tamsulosin (FLOMAX) 0.4 MG CAPS capsule Take 0.4 mg by mouth at bedtime.   Yes [provider]  testosterone cypionate (DEPOTESTOSTERONE  CYPIONATE) 200 MG/ML injection Inject 100 mg into the muscle every 14 (fourteen) days. 08/14/21  Yes [provider]  enoxaparin (LOVENOX) 100 MG/ML injection Inject 1 mL (100 mg total) into the skin daily. Patient not taking: Reported on 10/11/2021 02/06/20   Doreatha Massed, MD  polyethylene glycol [facility] / Ethelene Hal) packet Take 17 g by mouth daily. Patient not taking: Reported on 10/11/2021 01/07/18   Mesner, Barbara Cower, MD      Allergies    Augmentin [amoxicillin-pot clavulanate]    Review of Systems   Review of Systems  Physical Exam Updated Vital Signs BP 108/61   Pulse 73   Temp 97.8 F (36.6 C) (Oral)   Resp 14   Ht 1.829 m (6')   Wt 68 kg   SpO2 96%   BMI 20.34 kg/m  Physical Exam Vitals and nursing note reviewed.  Constitutional:      General: He is not in acute distress.    Appearance: He is well-developed.  HENT:     Head: Normocephalic and atraumatic.     Right Ear: External ear normal.     Left Ear: External ear normal.  Eyes:     General: No scleral icterus.       Right eye: No discharge.        Left eye: No discharge.     Conjunctiva/sclera: Conjunctivae normal.  Neck:     Trachea: No tracheal deviation.  Cardiovascular:     Rate and Rhythm: Normal rate and regular rhythm.  Pulmonary:  Effort: Pulmonary effort is normal. No respiratory distress.     Breath sounds: Normal breath sounds. No stridor. No wheezing or rales.  Abdominal:     General: Bowel sounds are normal. There is no distension.     Palpations: Abdomen is soft.     Tenderness: There is no abdominal tenderness. There is no guarding or rebound.  Musculoskeletal:        General: No tenderness or deformity.     Cervical back: Neck supple.  Skin:    General: Skin is warm and dry.     Findings: No rash.  Neurological:     Mental Status: He is alert. Mental status is at baseline.     Cranial Nerves: No cranial nerve deficit (no facial droop, extraocular movements intact,).      Sensory: No sensory deficit.     Motor: No abnormal muscle tone or seizure activity.  Psychiatric:        Mood and Affect: Mood normal.     ED Results / Procedures / Treatments   Labs (all labs ordered are listed, but only abnormal results are displayed) Labs Reviewed  BASIC METABOLIC PANEL - Abnormal; Notable for the following components:      Result Value   Glucose, Bld 103 (*)    BUN 28 (*)    Creatinine, Ser 1.50 (*)    GFR, Estimated 45 (*)    All other components within normal limits  CBC  URINALYSIS, ROUTINE W REFLEX MICROSCOPIC  CBG MONITORING, ED  CBG MONITORING, ED    EKG EKG Interpretation  Date/Time:  Friday October 11 2021 12:40:38 EDT Ventricular Rate:  74 PR Interval:  176 QRS Duration: 82 QT Interval:  372 QTC Calculation: 412 R Axis:   80 Text Interpretation: probable sinus rhythm, poor quality Abnormal ECG When compared with ECG of 02-Jul-2021 12:19, Poor data quality in current ECG precludes serial comparison Confirmed by Linwood Dibbles [phone]) on 10/11/2021 12:45:27 PM  Radiology No results found.  Procedures Procedures    Medications Ordered in ED Medications  sodium chloride 0.9 % bolus 1,000 mL (has no administration in time range)    ED Course/ Medical Decision Making/ A&P Clinical Course as of 10/11/21 1538  Fri Oct 11, 2021  1536 CBC [JK]  1537 CBC Normal [JK]  1537 Basic metabolic panel(!) Creatinine elevated compared to previous [JK]    Clinical Course User Index [JK] Linwood Dibbles, MD                           Medical Decision Making Amount and/or Complexity of Data Reviewed Labs: ordered. Decision-making details documented in ED Course.   Patient presented to the ED for evaluation of low blood pressure.  Patient reportedly had blood pressures in the 70s at home yesterday.  Vital signs today however show blood pressures consistently above 100.  Patient does not have any fevers or chills.  No blood in the stool.  No chest pain or  shortness of breath.  Metabolic panel does show slight increase in his creatinine.  I have ordered a liter of IV fluids.  Urinalysis is pending.  If normal anticipate discharge with outpatient follow-up.        Final Clinical Impression(s) / ED Diagnoses Final diagnoses:  Elevated serum creatinine    Rx / DC Orders ED Discharge Orders     None         Linwood Dibbles, MD 10/11/21 1539

## 2021-10-17 ENCOUNTER — Inpatient Hospital Stay: Admission: RE | Admit: 2021-10-17 | Payer: Medicare Other | Source: Ambulatory Visit

## 2021-10-27 ENCOUNTER — Ambulatory Visit
Admission: RE | Admit: 2021-10-27 | Discharge: 2021-10-27 | Disposition: A | Discharge status: Home / Self Care | Payer: Medicare Other | Source: Ambulatory Visit | Attending: Family Medicine | Admitting: Family Medicine

## 2021-10-27 DIAGNOSIS — S32000A Wedge compression fracture of unspecified lumbar vertebra, initial encounter for closed fracture: Secondary | ICD-10-CM

## 2021-10-27 DIAGNOSIS — W19XXXA Unspecified fall, initial encounter: Secondary | ICD-10-CM

## 2021-12-20 ENCOUNTER — Other Ambulatory Visit: Payer: Self-pay

## 2021-12-20 ENCOUNTER — Other Ambulatory Visit (HOSPITAL_COMMUNITY): Payer: Self-pay | Admitting: Hematology

## 2021-12-20 MED ORDER — APIXABAN 5 MG PO TABS: 5.0000 mg | ORAL_TABLET | Freq: Two times a day (BID) | ORAL | 3 refills | Status: DC

## 2022-03-05 ENCOUNTER — Other Ambulatory Visit: Payer: Self-pay | Admitting: Physician Assistant

## 2022-03-05 DIAGNOSIS — Z7901 Long term (current) use of anticoagulants: Secondary | ICD-10-CM

## 2022-03-05 NOTE — Progress Notes (Unsigned)
[facility] [address], [place] [address]   CLINIC:  Medical Oncology/Hematology  PCP:  System, Provider Not In No address on file None   REASON FOR VISIT:  Follow-up for PE x2 (2014 and 2021)   PRIOR THERAPY: Eliquis for 6 months in 01/2013   CURRENT THERAPY: Eliquis 5 mg BID  INTERVAL HISTORY:  [name] 87 y.o. male returns for routine follow-up of his history of recurrent unprovoked pulmonary embolisms.  He was last seen by Rojelio Brenner PA-C on 08/28/2021.  He is accompanied today by his wife and caregiver, who assist with history.  At today's visit, he reports feeling fair.  Since his last visit, he has now had chronic indwelling Foley catheter. No recent hospitalizations, surgeries, or changes in baseline health status.  He continues to take Eliquis as prescribed.  He denies any bright red blood per rectum or melena.  He had ED visit for fall on 09/02/2021.  Patient's wife reports that he has not had any falls since May 2023.  His wife and caregiver are very careful to decrease his risk of falling at home, in light of his Parkinson's disease. No current symptoms concerning for recurrent DVT or PE.  He has 60% energy and 100% appetite. He endorses that he is maintaining a stable weight.   REVIEW OF SYSTEMS:  Review of Systems  Constitutional:  Positive for fatigue. Negative for appetite change, chills, diaphoresis, fever and unexpected weight change.  HENT:   Negative for lump/mass and nosebleeds.   Eyes:  Negative for eye problems.  Respiratory:  Negative for chest tightness, cough, hemoptysis and shortness of breath.   Cardiovascular:  Negative for chest pain, leg swelling and palpitations.  Gastrointestinal:  Negative for abdominal pain, blood in stool, constipation, diarrhea, nausea and vomiting.  Genitourinary:  Positive for dysuria (chronic Foley, frequent UTIs). Negative for hematuria.   Musculoskeletal:  Positive for gait problem.   Skin: Negative.   Neurological:  Positive for dizziness, gait problem and numbness. Negative for headaches and light-headedness.  Hematological:  Does not bruise/bleed easily.      PAST MEDICAL/SURGICAL HISTORY:  Past Medical History:  Diagnosis Date   Dementia [facility])    wife denies history of dementia.   GERD (gastroesophageal reflux disease)    Parkinson disease (HCC)    Thyroid disease    Past Surgical History:  Procedure Laterality Date   DEEP BRAIN STIMULATOR PLACEMENT       SOCIAL HISTORY:  Social History   Socioeconomic History   Marital status: Married    Spouse name: Not on file   Number of children: 2   Years of education: Not on file   Highest education level: Not on file  Occupational History   Occupation: Retired Probation officer  Tobacco Use   Smoking status: Never   Smokeless tobacco: Never  Vaping Use   Vaping Use: Never used  Substance and Sexual Activity   Alcohol use: Never   Drug use: Never   Sexual activity: Not Currently  Other Topics Concern   Not on file  Social History Narrative   Not on file   Social Determinants of Health   Financial Resource Strain: Not on file  Food Insecurity: Not on file  Transportation Needs: Not on file  Physical Activity: Not on file  Stress: Not on file  Social Connections: Not on file  Intimate Partner Violence: Not on file    FAMILY HISTORY:  No family history on file.  CURRENT MEDICATIONS:  Outpatient Encounter Medications as of 03/06/2022  Medication Sig   apixaban (ELIQUIS) 5 MG TABS tablet Take 1 tablet (5 mg total) by mouth 2 (two) times daily.   carbidopa-levodopa (SINEMET IR) 25-100 MG tablet Take 1 tablet by mouth every 3 (three) hours.   donepezil (ARICEPT ODT) 5 MG disintegrating tablet Take 5 mg by mouth at bedtime.   dorzolamide-timolol (COSOPT) 22.3-6.8 MG/ML ophthalmic solution Place 1 drop into both eyes 2 (two) times daily.    enoxaparin (LOVENOX) 100 MG/ML injection Inject 1 mL (100 mg  total) into the skin daily. (Patient not taking: Reported on 10/11/2021)   latanoprost (XALATAN) 0.005 % ophthalmic solution Place 1 drop into both eyes at bedtime.   Multiple Vitamins-Minerals (MULTIVITAMIN WITH MINERALS) tablet Take 1 tablet by mouth daily.   polyethylene glycol (MIRALAX / GLYCOLAX) packet Take 17 g by mouth daily. (Patient not taking: Reported on 10/11/2021)   tamsulosin (FLOMAX) 0.4 MG CAPS capsule Take 0.4 mg by mouth at bedtime.   testosterone cypionate (DEPOTESTOSTERONE CYPIONATE) 200 MG/ML injection Inject 100 mg into the muscle every 14 (fourteen) days.   No facility-administered encounter medications on file as of 03/06/2022.    ALLERGIES:  Allergies  Allergen Reactions   Augmentin [Amoxicillin-Pot Clavulanate] Other (See Comments)    Felt like he had tightness in his chest     PHYSICAL EXAM:  ECOG PERFORMANCE STATUS: 2 - Symptomatic, <50% confined to bed  There were no vitals filed for this visit. There were no vitals filed for this visit. Physical Exam Vitals reviewed.  Constitutional:      Appearance: Normal appearance.     Comments: Ambulates with walker  Cardiovascular:     Rate and Rhythm: Normal rate and regular rhythm.     Pulses: Normal pulses.     Heart sounds: Normal heart sounds.  Pulmonary:     Effort: Pulmonary effort is normal.     Breath sounds: Normal breath sounds.  Neurological:     General: No focal deficit present.     Mental Status: He is alert.     Comments: Nonverbal during visit  Psychiatric:        Mood and Affect: Mood normal.        Behavior: Behavior normal.      LABORATORY DATA:  I have reviewed the labs as listed.  CBC    Component Value Date/Time   WBC 7.3 10/11/2021 1251   RBC 4.69 10/11/2021 1251   HGB 14.5 10/11/2021 1251   HCT 44.3 10/11/2021 1251   PLT 215 10/11/2021 1251   MCV 94.5 10/11/2021 1251   MCH 30.9 10/11/2021 1251   MCHC 32.7 10/11/2021 1251   RDW 13.0 10/11/2021 1251   LYMPHSABS 1.1  07/02/2021 1311   MONOABS 0.6 07/02/2021 1311   EOSABS 0.5 07/02/2021 1311   BASOSABS 0.1 07/02/2021 1311      Latest Ref Rng & Units 10/11/2021   12:51 PM 07/02/2021    1:11 PM 04/03/2021   12:57 PM  CMP  Glucose 70 - 99 mg/dL 026  94  378   BUN 8 - 23 mg/dL 28  31  34   Creatinine 0.61 - 1.24 mg/dL 5.88  5.02  7.74   Sodium 135 - 145 mmol/L 135  135  136   Potassium 3.5 - 5.1 mmol/L 4.5  4.2  4.6   Chloride 98 - 111 mmol/L 102  101  103   CO2 22 - 32 mmol/L 26  25  26   Calcium 8.9 - 10.3 mg/dL 9.4  8.9  9.3   Total Protein 6.5 - 8.1 g/dL  5.9    Total Bilirubin 0.3 - 1.2 mg/dL  0.6    Alkaline Phos 38 - 126 U/L  85    AST 15 - 41 U/L  18    ALT 0 - 44 U/L  11      DIAGNOSTIC IMAGING:  I have independently reviewed the relevant imaging and discussed with the patient.  ASSESSMENT & PLAN: 1.  Recurrent unprovoked pulmonary embolism: - He had DVT/PE in the leg few years ago (October 2014) and was treated with 6 months of anticoagulation.  (Precipitating circumstances unknown.  Patient is poor historian.) - CT angio on 12/28/2019 shows acute bilateral pulmonary emboli with CT evidence of right heart strain. - Patient was not severely immobile although his mobility is slightly limited by Parkinson's disease. - Reviewed labs from recent hospitalization from 01/01/2020.  Factor V Leiden, prothrombin gene mutation, lupus anticoagulant, beta-2 glycoprotein 1 antibody, anticardiolipin antibody were negative.  Protein C&S levels were normal.  Antithrombin III was normal. - He is continuing Eliquis twice daily.  No bright red blood per rectum or melena. - ED visit on 09/02/2021 for fall.  Wife denies any falls at home since that time. - Labs today (03/06/2022): D-dimer <0.27.  Normal CBC with Hgb 14.0.  Creatinine 1.06. - As he had unprovoked to weakly provoked pulmonary embolism with right heart strain in 2021, we have recommended indefinite anticoagulation at this time, especially in light of  his prior DVT/PE in 2014. - Most recently recorded weight is approximately 68 kg (10/11/2021) - he was unable to stand to check weight at today's visit - Discussed extensively with patient and his wife that risks of continuing anticoagulation include possibility of severe hemorrhage causing death by exsanguination, as well as the risk of cerebral hemorrhage or subdural hematoma given his high risk of falls related to his Parkinson's disease.  Patient and his wife expressed a strong preference to remain on Eliquis at this time, as they are very concerned about any recurrent blood clots in the future.  They verbalized understanding and acceptance of the risks of continued anticoagulation at this time - PLAN: Continue Eliquis 5 mg twice daily (since patient is age >80 years, he would require dose adjustment if weight <60 kg OR creatinine >1.5)). -  Repeat CMP, D-dimer and CBC with office visit in 6 months.    2.  Parkinson's disease: - He has deep brain stimulator (DBS) placed at [facility] in 2009.  He has dysarthria and hypophonia. - Battery was changed on 02/14/2020.   PLAN SUMMARY: >> Labs in 6 months (CBC/D, CMP, D-dimer) >> Office visit same day as labs in 6 months   All questions were answered. The patient knows to call the clinic with any problems, questions or concerns.  Medical decision making: Low  Time spent on visit: I spent 15 minutes counseling the patient face to face. The total time spent in the appointment was 22 minutes and more than 50% was on counseling.   Carnella Guadalajara, PA-C  03/06/2022 12:25 PM

## 2022-03-06 ENCOUNTER — Inpatient Hospital Stay: Payer: Medicare Other | Attending: Physician Assistant

## 2022-03-06 ENCOUNTER — Other Ambulatory Visit: Payer: Self-pay

## 2022-03-06 ENCOUNTER — Inpatient Hospital Stay (HOSPITAL_BASED_OUTPATIENT_CLINIC_OR_DEPARTMENT_OTHER): Payer: Medicare Other | Admitting: Physician Assistant

## 2022-03-06 VITALS — BP 98/56 | HR 64 | Temp 98.3°F | Resp 16

## 2022-03-06 DIAGNOSIS — I2699 Other pulmonary embolism without acute cor pulmonale: Secondary | ICD-10-CM

## 2022-03-06 DIAGNOSIS — G20A1 Parkinson's disease without dyskinesia, without mention of fluctuations: Secondary | ICD-10-CM | POA: Diagnosis not present

## 2022-03-06 DIAGNOSIS — Z7901 Long term (current) use of anticoagulants: Secondary | ICD-10-CM | POA: Diagnosis not present

## 2022-03-06 DIAGNOSIS — Z86711 Personal history of pulmonary embolism: Secondary | ICD-10-CM | POA: Insufficient documentation

## 2022-03-06 LAB — COMPREHENSIVE METABOLIC PANEL
ALT: 11 U/L (ref 0–44)
AST: 19 U/L (ref 15–41)
Albumin: 3.3 g/dL — ABNORMAL LOW (ref 3.5–5.0)
Alkaline Phosphatase: 80 U/L (ref 38–126)
Anion gap: 5 (ref 5–15)
BUN: 26 mg/dL — ABNORMAL HIGH (ref 8–23)
CO2: 29 mmol/L (ref 22–32)
Calcium: 8.9 mg/dL (ref 8.9–10.3)
Chloride: 103 mmol/L (ref 98–111)
Creatinine, Ser: 1.06 mg/dL (ref 0.61–1.24)
GFR, Estimated: >60 mL/min (ref >60)
Glucose, Bld: 117 mg/dL — ABNORMAL HIGH (ref 70–99)
Potassium: 4.4 mmol/L (ref 3.5–5.1)
Sodium: 137 mmol/L (ref 135–145)
Total Bilirubin: 0.6 mg/dL (ref 0.3–1.2)
Total Protein: 5.7 g/dL — ABNORMAL LOW (ref 6.5–8.1)

## 2022-03-06 LAB — CBC
HCT: 42.8 % (ref 39.0–52.0)
Hemoglobin: 14 g/dL (ref 13.0–17.0)
MCH: 31.5 pg (ref 26.0–34.0)
MCHC: 32.7 g/dL (ref 30.0–36.0)
MCV: 96.2 fL (ref 80.0–100.0)
Platelets: 173 10*3/uL (ref 150–400)
RBC: 4.45 MIL/uL (ref 4.22–5.81)
RDW: 13.4 % (ref 11.5–15.5)
WBC: 6.8 10*3/uL (ref 4.0–10.5)
nRBC: 0 % (ref 0.0–0.2)

## 2022-03-06 LAB — D-DIMER, QUANTITATIVE: D-Dimer, Quant: <0.27 ug/mL-FEU (ref 0.00–0.50)

## 2022-03-06 NOTE — Patient Instructions (Signed)
[facility] at [facility] Discharge Instructions  You were seen today by Rojelio Brenner PA-C for your history of blood clots.  You are currently on Eliquis, which is a blood thinner that will help prevent any further blood clots.  However, Eliquis also increases your risks of bleeding.  After our in-depth discussion of risks and benefits of blood thinners, you expressed that you would prefer to stay on the blood thinner for now.  This is reasonable, but if you start to have more frequent falls at home, I would recommend that the Eliquis be stopped.  We will see you again in 6 months to reassess ongoing risks and benefits of blood thinners.  Seek immediate medical attention if you have any signs of bleeding.  You should also seek medical attention after any falls at home, especially if you hit your head.  FOLLOW-UP APPOINTMENT: Labs and office visit in 6 months  ** Thank you for trusting me with your healthcare!  I strive to provide all of my patients with quality care at each visit.  If you receive a survey for this visit, I would be so grateful to you for taking the time to provide feedback.  Thank you in advance!  ~ [facility]                   Dr. Doreatha Massed   &   Rojelio Brenner, PA-C   - - - - - - - - - - - - - - - - - -     Thank you for choosing [facility] at [facility] to provide your oncology and hematology care.  To afford each patient quality time with our provider, please arrive at least 15 minutes before your scheduled appointment time.   If you have a lab appointment with the Cancer Center please come in thru the Main Entrance and check in at the main information desk.  You need to re-schedule your appointment should you arrive 10 or more minutes late.  We strive to give you quality time with our providers, and arriving late affects you and other patients whose appointments are after yours.  Also, if you no show three or  more times for appointments you may be dismissed from the clinic at the providers discretion.     Again, thank you for choosing [facility].  Our hope is that these requests will decrease the amount of time that you wait before being seen by our physicians.       _____________________________________________________________  Should you have questions after your visit to [facility], please contact our office at [phone] and follow the prompts.  Our office hours are 8:00 a.m. and 4:30 p.m. Monday - Friday.  Please note that voicemails left after 4:00 p.m. may not be returned until the following business day.  We are closed weekends and major holidays.  You do have access to a nurse 24-7, just call the main number to the clinic [phone] and do not press any options, hold on the line and a nurse will answer the phone.    For prescription refill requests, have your pharmacy contact our office and allow 72 hours.    Due to Covid, you will need to wear a mask upon entering the hospital. If you do not have a mask, a mask will be given to you at the Main Entrance upon arrival. For doctor visits, patients may have 1  support person age 22 or older with them. For treatment visits, patients can not have anyone with them due to social distancing guidelines and our immunocompromised population.

## 2022-06-04 ENCOUNTER — Encounter: Payer: Self-pay | Admitting: Cardiovascular Disease

## 2022-06-12 ENCOUNTER — Encounter (HOSPITAL_COMMUNITY): Payer: Self-pay | Admitting: Urology

## 2022-06-13 ENCOUNTER — Other Ambulatory Visit (HOSPITAL_COMMUNITY): Payer: Self-pay | Admitting: Urology

## 2022-06-13 ENCOUNTER — Encounter: Payer: Self-pay | Admitting: Physician Assistant

## 2022-06-13 DIAGNOSIS — N319 Neuromuscular dysfunction of bladder, unspecified: Secondary | ICD-10-CM

## 2022-06-13 NOTE — Progress Notes (Signed)
Contacted by urologist office today regarding anticoagulation protocol for upcoming procedure. Patient can hold Eliquis for 48 to 72 hours prior to urology procedure.  Eliquis should be resumed following procedure as soon as urologist believes it to be safe.  Harriett Rush, PA-C 06/13/22 3:18 PM

## 2022-06-21 ENCOUNTER — Encounter: Payer: Self-pay | Admitting: Cardiovascular Disease

## 2022-07-01 ENCOUNTER — Other Ambulatory Visit: Payer: Self-pay | Admitting: Radiology

## 2022-07-02 ENCOUNTER — Other Ambulatory Visit: Payer: Self-pay

## 2022-07-02 ENCOUNTER — Ambulatory Visit (HOSPITAL_COMMUNITY)
Admission: RE | Admit: 2022-07-02 | Discharge: 2022-07-02 | Disposition: A | Discharge status: Home / Self Care | Payer: Medicare Other | Source: Ambulatory Visit | Attending: Urology | Admitting: Urology

## 2022-07-02 DIAGNOSIS — G20A1 Parkinson's disease without dyskinesia, without mention of fluctuations: Secondary | ICD-10-CM | POA: Diagnosis not present

## 2022-07-02 DIAGNOSIS — F0280 Dementia in other diseases classified elsewhere without behavioral disturbance: Secondary | ICD-10-CM | POA: Insufficient documentation

## 2022-07-02 DIAGNOSIS — R339 Retention of urine, unspecified: Secondary | ICD-10-CM | POA: Insufficient documentation

## 2022-07-02 DIAGNOSIS — N319 Neuromuscular dysfunction of bladder, unspecified: Secondary | ICD-10-CM

## 2022-07-02 MED ORDER — FENTANYL CITRATE (PF) 100 MCG/2ML IJ SOLN
INTRAMUSCULAR | Status: AC
  Filled 2022-07-02: qty 2

## 2022-07-02 MED ORDER — FENTANYL CITRATE (PF) 100 MCG/2ML IJ SOLN
INTRAMUSCULAR | Status: AC | PRN
  Administered 2022-07-02 (×2): 25 ug via INTRAVENOUS

## 2022-07-02 MED ORDER — SODIUM CHLORIDE 0.9 % IV SOLN: INTRAVENOUS | Status: DC

## 2022-07-02 MED ORDER — MIDAZOLAM HCL 2 MG/2ML IJ SOLN
INTRAMUSCULAR | Status: AC
  Filled 2022-07-02: qty 2

## 2022-07-02 MED ORDER — MIDAZOLAM HCL 2 MG/2ML IJ SOLN
INTRAMUSCULAR | Status: AC | PRN
  Administered 2022-07-02: 1 mg via INTRAVENOUS

## 2022-07-02 NOTE — H&P (Signed)
Chief Complaint: Patient was seen in consultation today for urinary retention; suprapubic catheter placement  Referring Physician(s): Gay,Matthew R  Supervising Physician: Sandi Mariscal  Patient Status: [facility] - Out[facility]  History of Present Illness: Gary Sellers is an 88 y.o. male with a medical history significant for dementia and Parkinson disease. He is s/p medtronic neurostimulator placement in 2009. He has neuromuscular bladder dysfunction with a history of urinary tract infections. He is followed by Urology and he has had a foley catheter in place since last fall.   Interventional Radiology has been asked to evaluate this patient for an image-guided placement of a suprapubic catheter.   Past Medical History:  Diagnosis Date   Dementia [facility])    wife denies history of dementia.   GERD (gastroesophageal reflux disease)    Parkinson disease (HCC)    Thyroid disease     Past Surgical History:  Procedure Laterality Date   DEEP BRAIN STIMULATOR PLACEMENT      Allergies: Augmentin [amoxicillin-pot clavulanate]  Medications: Prior to Admission medications   Medication Sig Start Date End Date Taking? Authorizing Provider  anastrozole (ARIMIDEX) 1 MG tablet Take by mouth. 01/26/21   [provider]  apixaban (ELIQUIS) 5 MG TABS tablet Take 1 tablet (5 mg total) by mouth 2 (two) times daily. 12/20/21   Derek Jack, MD  carbidopa-levodopa (SINEMET IR) 25-100 MG tablet Take 1 tablet by mouth every 3 (three) hours. 12/09/19   [provider]  Coenzyme Q10 10 MG capsule Take by mouth.    [provider]  donepezil (ARICEPT ODT) 5 MG disintegrating tablet Take 5 mg by mouth at bedtime. 08/16/21   [provider]  dorzolamide-timolol (COSOPT) 22.3-6.8 MG/ML ophthalmic solution Place 1 drop into both eyes 2 (two) times daily.  11/25/12   [provider]  enoxaparin (LOVENOX) 100 MG/ML injection Inject 1 mL (100 mg total) into the skin  daily. 02/06/20   Derek Jack, MD  latanoprost (XALATAN) 0.005 % ophthalmic solution Place 1 drop into both eyes at bedtime. 08/25/21   [provider]  Multiple Vitamins-Minerals (MULTIVITAMIN WITH MINERALS) tablet Take 1 tablet by mouth daily.    [provider]  nitrofurantoin, macrocrystal-monohydrate, (MACROBID) 100 MG capsule Take by mouth. 02/22/22   [provider]  polyethylene glycol (MIRALAX / GLYCOLAX) packet Take 17 g by mouth daily. 01/07/18   Mesner, Corene Cornea, MD  tamsulosin (FLOMAX) 0.4 MG CAPS capsule Take 0.4 mg by mouth at bedtime.    [provider]  testosterone cypionate (DEPOTESTOSTERONE CYPIONATE) 200 MG/ML injection Inject 100 mg into the muscle every 14 (fourteen) days. 08/14/21   [provider]     No family history on file.  Social History   Socioeconomic History   Marital status: Married    Spouse name: Not on file   Number of children: 2   Years of education: Not on file   Highest education level: Not on file  Occupational History   Occupation: Retired Designer, television/film set  Tobacco Use   Smoking status: Never   Smokeless tobacco: Never  Vaping Use   Vaping Use: Never used  Substance and Sexual Activity   Alcohol use: Never   Drug use: Never   Sexual activity: Not Currently  Other Topics Concern   Not on file  Social History Narrative   Not on file   Social Determinants of Health   Financial Resource Strain: Not on file  Food Insecurity: Not on file  Transportation Needs: Not on file  Physical Activity: Not on file  Stress: Not on file  Social Connections: Not on file    Review of Systems: A 12 point ROS discussed and pertinent positives are indicated in the HPI above.  All other systems are negative.  Review of Systems  Constitutional:  Negative for appetite change and fatigue.  Respiratory:  Negative for cough and shortness of breath.   Cardiovascular:  Negative for chest pain and leg swelling.   Gastrointestinal:  Negative for abdominal pain, diarrhea, nausea and vomiting.  Genitourinary:  Positive for difficulty urinating.  Neurological:  Negative for dizziness and headaches.    Vital Signs: BP 123/75 (BP Location: Right Arm)   Temp 97.9 F (36.6 C) (Oral)   Ht '5\' 9"'$  (1.753 m)   Wt 147 lb (66.7 kg)   SpO2 98%   BMI 21.71 kg/m   Physical Exam Constitutional:      Appearance: He is underweight.  HENT:     Ears:     Comments: Hard of hearing    Mouth/Throat:     Mouth: Mucous membranes are moist.     Pharynx: Oropharynx is clear.  Cardiovascular:     Rate and Rhythm: Normal rate and regular rhythm.     Pulses: Normal pulses.     Heart sounds: Normal heart sounds.     Comments: Left upper chest battery pack for brain stimulator.  Pulmonary:     Effort: Pulmonary effort is normal.     Breath sounds: Normal breath sounds.  Abdominal:     General: Bowel sounds are normal.     Palpations: Abdomen is soft.  Genitourinary:    Comments: Foley catheter in place.  Musculoskeletal:     Left lower leg: No edema.  Skin:    General: Skin is warm and dry.  Neurological:     Mental Status: He is alert. He is disoriented.     Comments: Follows commands.      Imaging: No results found.  Labs:  CBC: Recent Labs    07/02/21 1311 10/11/21 1251 03/06/22 1000  WBC 7.2 7.3 6.8  HGB 15.4 14.5 14.0  HCT 46.5 44.3 42.8  PLT 236 215 173    COAGS: No results for input(s): "INR", "APTT" in the last 8760 hours.  BMP: Recent Labs    07/02/21 1311 10/11/21 1251 03/06/22 1000  NA 135 135 137  K 4.2 4.5 4.4  CL 101 102 103  CO2 '25 26 29  '$ GLUCOSE 94 103* 117*  BUN 31* 28* 26*  CALCIUM 8.9 9.4 8.9  CREATININE 1.16 1.50* 1.06  GFRNONAA >60 45* >60    LIVER FUNCTION TESTS: Recent Labs    07/02/21 1311 03/06/22 1000  BILITOT 0.6 0.6  AST 18 19  ALT 11 11  ALKPHOS 85 80  PROT 5.9* 5.7*  ALBUMIN 3.7 3.3*    TUMOR MARKERS: No results for input(s):  "AFPTM", "CEA", "CA199", "CHROMGRNA" in the last 8760 hours.  Assessment and Plan:  Neuromuscular bladder dysfunction: Halford Decamp, 88 year old male, presents today to the [facility] Radiology department for an image-guided placement of a suprapubic catheter.   Risks and benefits discussed with the patient including bleeding, infection, damage to adjacent structures, bowel perforation/fistula connection, and sepsis.  All of the patient's questions were answered, patient is agreeable to proceed. He has been NPO. He is a full code. His last dose of Eliquis was Monday, 06/29/22.   Consent signed and in chart.  Thank you for this interesting consult.  I greatly enjoyed meeting Randie Ewin and look forward to participating in their care.  A copy of this report was sent to the requesting provider on this date.  Electronically Signed: Soyla Dryer, AGACNP-BC [phone] 07/02/2022, 9:44 AM   I spent a total of  30 Minutes   in face to face in clinical consultation, greater than 50% of which was counseling/coordinating care for suprapubic catheter placement.

## 2022-07-02 NOTE — Sedation Documentation (Signed)
Filling bladder with 250 cc of NS per Dr. Pascal Lux

## 2022-07-02 NOTE — Procedures (Signed)
Pre procedural Dx: Urinary retention Post procedural Dx: Same  Technically successful CT guided placed of a 14 Fr drainage catheter placement into the urinary bladder yielding slightly cloudy urine.   Suprapubic catheter connected to foley bag.  EBL: Trace Complications: None immediate  Ronny Bacon, MD Pager #: [phone]

## 2022-07-19 IMAGING — CT CT ANGIO CHEST
2 of 6 series · 18 of 46 positions shown · IV contrast (Omnipaque or Isovue)
Comparison: Chest x-ray 12/28/2019, CT 01/07/2018

CLINICAL DATA: Low oxygen saturation

EXAM:
CT ANGIOGRAPHY CHEST WITH CONTRAST
TECHNIQUE: Multidetector CT imaging of the chest was performed using the
standard protocol during bolus administration of intravenous
contrast. Multiplanar CT image reconstructions and MIPs were
obtained to evaluate the vascular anatomy.
CONTRAST:  100mL OMNIPAQUE IOHEXOL 350 MG/ML SOLN

[Series 5: pe axial thins · axial · 0.63mm/px · z∈[+1320,+1562]mm · 15 of 266 slices shown]
[im 12/266  lung]
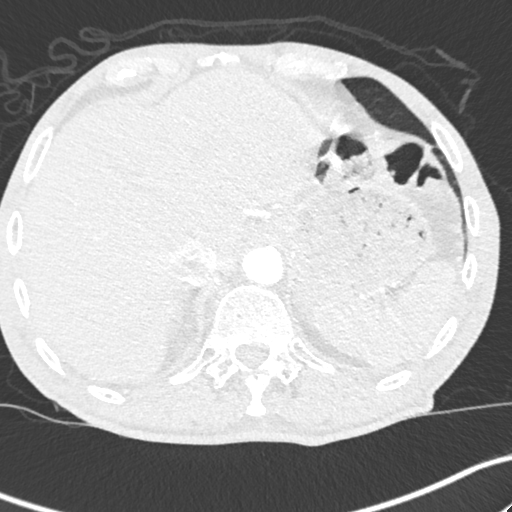
[im 35/266  soft-tissue]
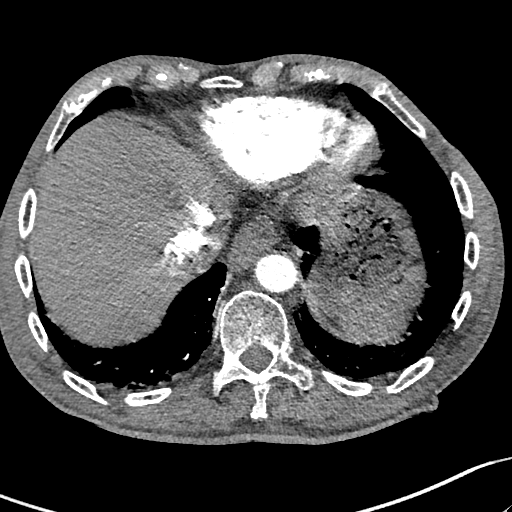
[im 47/266  lung]
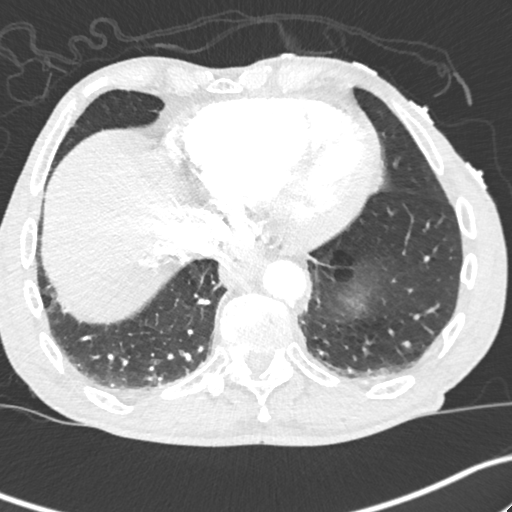
[im 70/266  soft-tissue]
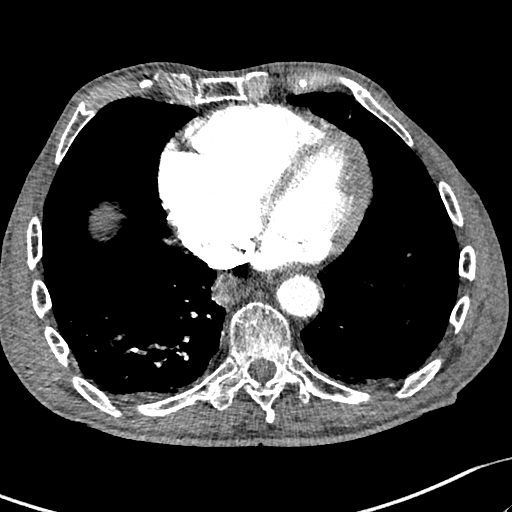
[im 81/266  lung]
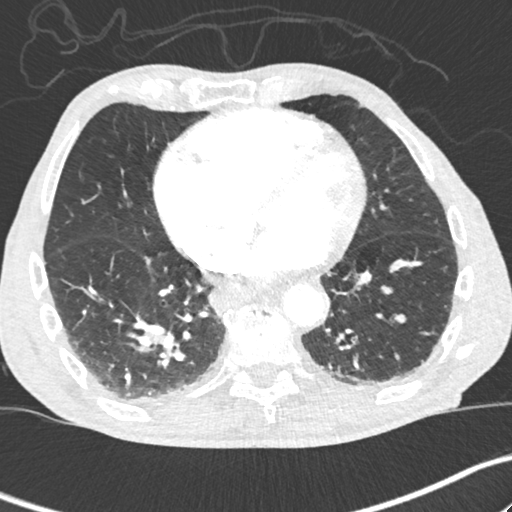
[im 104/266  soft-tissue]
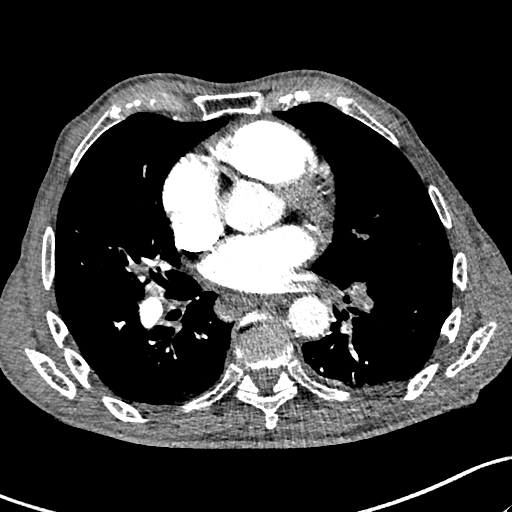
[im 116/266  lung]
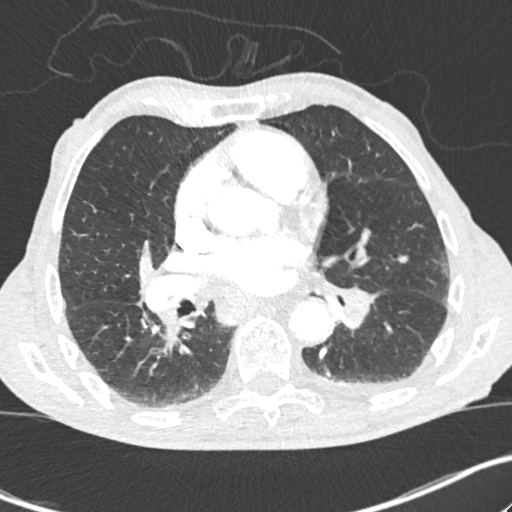
[im 139/266  soft-tissue]
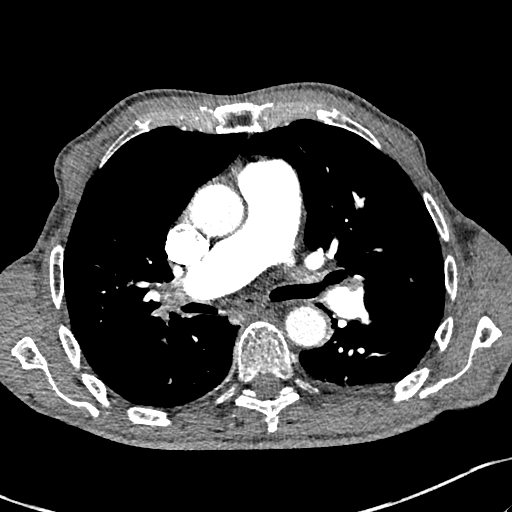
[im 150/266  lung]
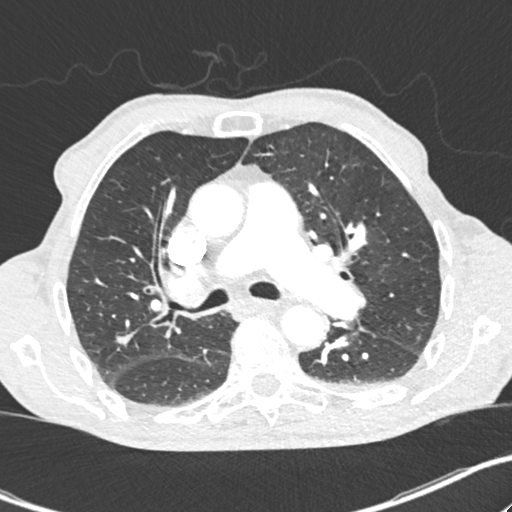
[im 162/266  soft-tissue]
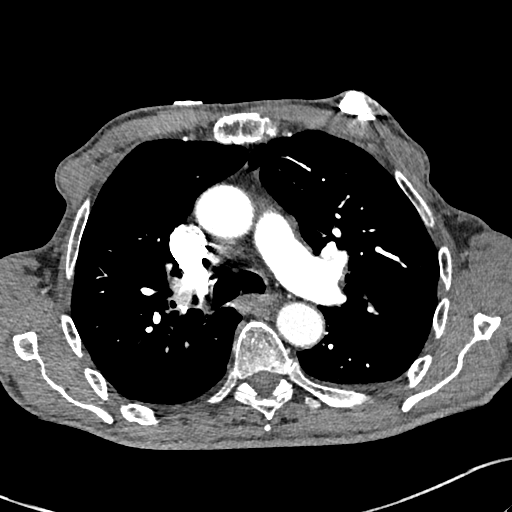
[im 185/266  lung]
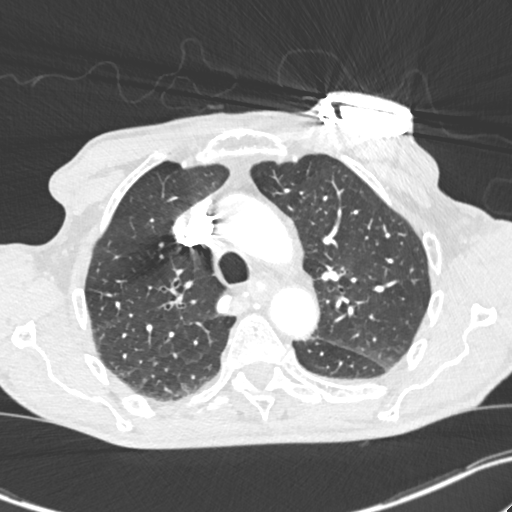
[im 196/266  soft-tissue]
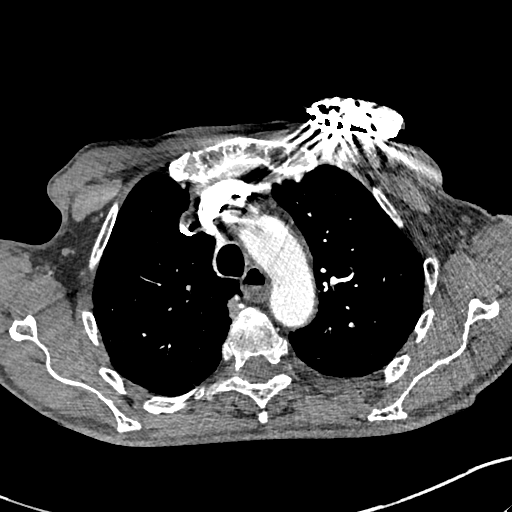
[im 219/266  lung]
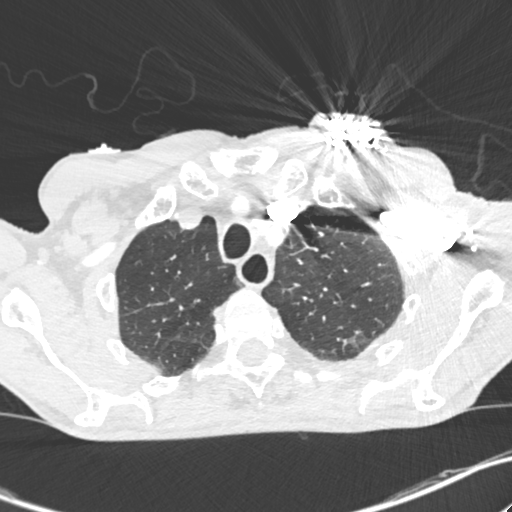
[im 231/266  soft-tissue]
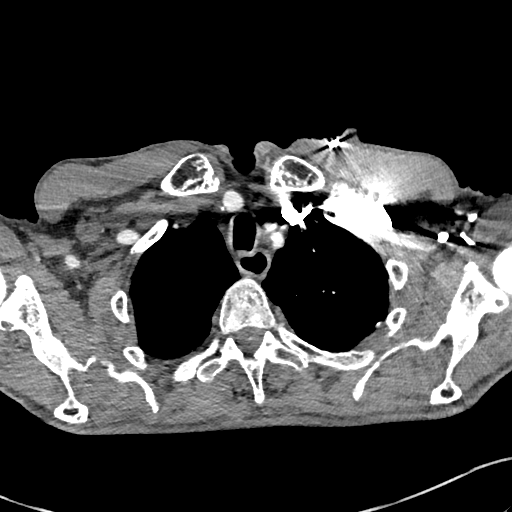
[im 254/266  lung]
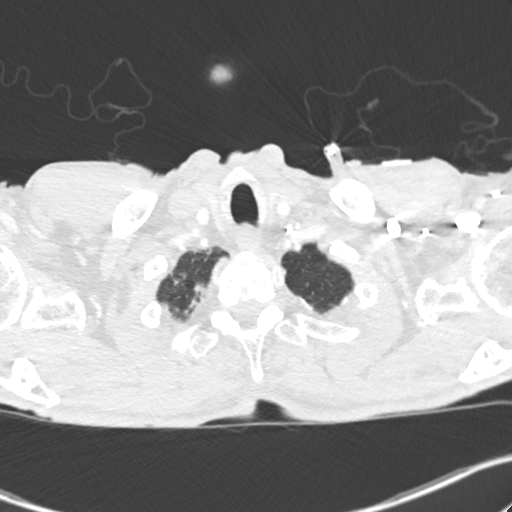

[Series 8: cor soft · coronal · 0.53mm/px · 3 of 127 slices shown]
[im 32/127  soft-tissue]
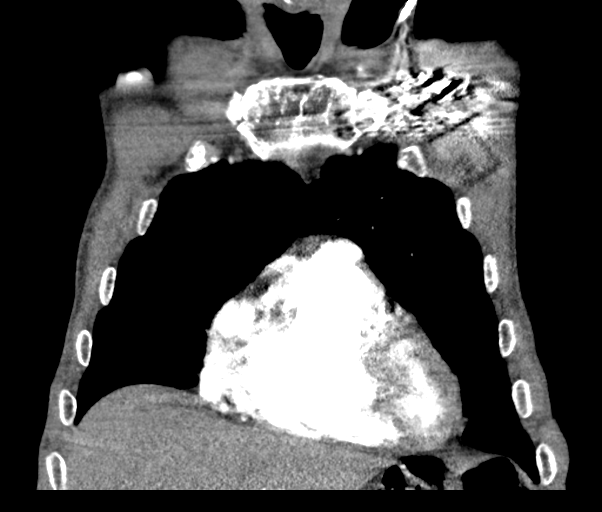
[im 64/127  soft-tissue]
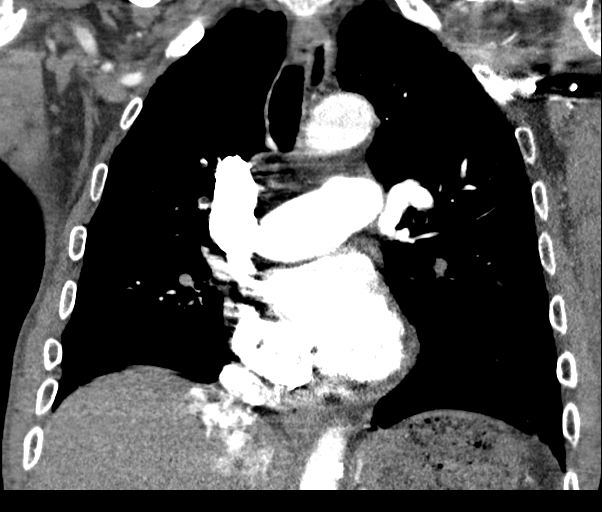
[im 95/127  soft-tissue]
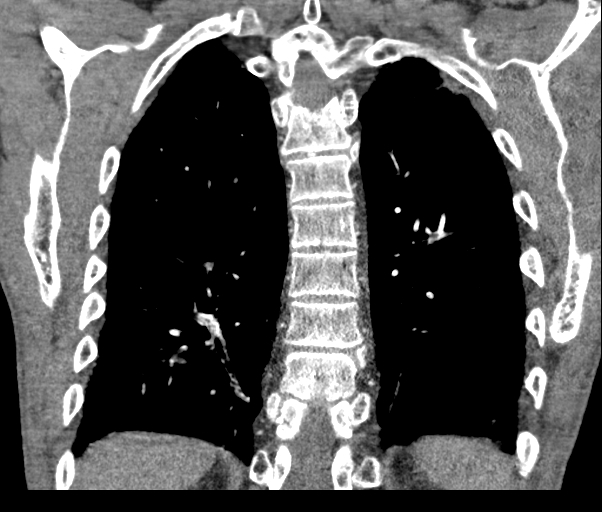

[18 of 46 positions shown; findings below may reference images not displayed]

FINDINGS: Cardiovascular: Satisfactory opacification of the pulmonary arteries
to the segmental level. Acute thrombus within the truncus anterior
artery, right upper lobe segmental and subsegmental vessels, right
middle lobar, segmental and subsegmental vessels, as well as right
inter lobar and right lower lobe segmental and subsegmental vessels.
Positive for acute embolus within descending left pulmonary artery,
left upper and lower lobe segmental and subsegmental pulmonary
vessels. Positive for right heart strain with RV LV ratio of 1.8.
Reflux of contrast into the intra hepatic IVC. Cardiomegaly. No
pericardial effusion.

Nonaneurysmal aorta. Mild aortic atherosclerosis. Mild coronary
vascular calcification.

Mediastinum/Nodes: Midline trachea. No thyroid mass. No suspicious
adenopathy. Mild air in fluid distension of upper esophagus.
Suspected distal esophageal thickening.

Lungs/Pleura: No pneumothorax or pleural effusion. Mild ground-glass
density in the posterior left upper lobe and the subpleural lingula.

Upper Abdomen: Nodular adrenal glands.  No acute abnormality.

Musculoskeletal: No chest wall abnormality. No acute or significant
osseous findings.

Review of the MIP images confirms the above findings.
IMPRESSION: 1. Positive for acute bilateral pulmonary emboli with CT evidence of
right heart strain (RV/LV Ratio = 1.8) consistent with at least
submassive (intermediate risk) PE. The presence of right heart
strain has been associated with an increased risk of morbidity and
mortality.
2. Cardiomegaly.
3. Mild ground-glass density in the posterior left upper lobe and
subpleural lingula, could reflect small foci of pneumonia, to
include atypical or viral etiology. Pulmonary infarctions are also
considered in the differential.
4. Aortic atherosclerosis.

Critical Value/emergent results were called by telephone at the time
of interpretation on 12/28/2019 at [DATE] to provider ANNE BENTE QVALE ,
who verbally acknowledged these results.

Aortic Atherosclerosis ([id number]).

## 2022-08-01 ENCOUNTER — Other Ambulatory Visit (HOSPITAL_COMMUNITY): Payer: Self-pay | Admitting: Urology

## 2022-08-01 DIAGNOSIS — N319 Neuromuscular dysfunction of bladder, unspecified: Secondary | ICD-10-CM

## 2022-08-13 ENCOUNTER — Other Ambulatory Visit: Payer: Self-pay | Admitting: Student

## 2022-08-13 DIAGNOSIS — R339 Retention of urine, unspecified: Secondary | ICD-10-CM

## 2022-08-14 ENCOUNTER — Ambulatory Visit (HOSPITAL_COMMUNITY)
Admission: RE | Admit: 2022-08-14 | Discharge: 2022-08-14 | Disposition: A | Discharge status: Home / Self Care | Payer: Medicare Other | Source: Ambulatory Visit | Attending: Urology | Admitting: Urology

## 2022-08-14 ENCOUNTER — Encounter (HOSPITAL_COMMUNITY): Payer: Self-pay

## 2022-08-14 ENCOUNTER — Other Ambulatory Visit: Payer: Self-pay

## 2022-08-14 DIAGNOSIS — R339 Retention of urine, unspecified: Secondary | ICD-10-CM

## 2022-08-14 DIAGNOSIS — N319 Neuromuscular dysfunction of bladder, unspecified: Secondary | ICD-10-CM | POA: Insufficient documentation

## 2022-08-14 MED ORDER — SODIUM CHLORIDE 0.9 % IV SOLN: INTRAVENOUS | Status: DC

## 2022-08-14 NOTE — Progress Notes (Signed)
Brief Interventional Radiology Note:   Pt arrives to short stay accompanied by family. Pt family reports patient ate biscuit, hash brown and orange juice prior to arrival. Pts family states that they were not advised not to eat after MN. Discussed case with Dr. Juliette Alcide who advises pt needs to reschedule and be NPO for suprapubic catheter upsize. Eliquis will not need to be held for upsize.  Family advised that scheduling will call them to reschedule and are in agreement with plan.     Alex Gardener, AGNP-BC 08/14/2022, 10:11 AM

## 2022-08-14 NOTE — Progress Notes (Signed)
Asher Muir, NP notified of client ate breakfast this morning and took eliquis last night

## 2022-08-14 NOTE — Progress Notes (Signed)
Per Asher Muir, NP procedure cancelled

## 2022-08-27 ENCOUNTER — Other Ambulatory Visit: Payer: Self-pay | Admitting: Radiology

## 2022-08-28 ENCOUNTER — Encounter (HOSPITAL_COMMUNITY): Payer: Self-pay

## 2022-08-28 ENCOUNTER — Ambulatory Visit (HOSPITAL_COMMUNITY)
Admission: RE | Admit: 2022-08-28 | Discharge: 2022-08-28 | Disposition: A | Discharge status: Home / Self Care | Payer: Medicare Other | Source: Ambulatory Visit | Attending: Urology | Admitting: Urology

## 2022-08-28 ENCOUNTER — Other Ambulatory Visit: Payer: Self-pay

## 2022-08-28 ENCOUNTER — Other Ambulatory Visit: Payer: Self-pay | Admitting: Student

## 2022-08-28 DIAGNOSIS — Z7901 Long term (current) use of anticoagulants: Secondary | ICD-10-CM | POA: Insufficient documentation

## 2022-08-28 DIAGNOSIS — R339 Retention of urine, unspecified: Secondary | ICD-10-CM | POA: Insufficient documentation

## 2022-08-28 DIAGNOSIS — N319 Neuromuscular dysfunction of bladder, unspecified: Secondary | ICD-10-CM | POA: Insufficient documentation

## 2022-08-28 DIAGNOSIS — Z435 Encounter for attention to cystostomy: Secondary | ICD-10-CM | POA: Diagnosis present

## 2022-08-28 DIAGNOSIS — G20A1 Parkinson's disease without dyskinesia, without mention of fluctuations: Secondary | ICD-10-CM | POA: Insufficient documentation

## 2022-08-28 HISTORY — PX: IR CATHETER TUBE CHANGE: IMG717

## 2022-08-28 MED ORDER — IOHEXOL 300 MG/ML  SOLN: 50.0000 mL | Freq: Once | INTRAMUSCULAR | Status: DC | PRN

## 2022-08-28 MED ORDER — LIDOCAINE HCL 1 % IJ SOLN
INTRAMUSCULAR | Status: AC
  Filled 2022-08-28: qty 20

## 2022-08-28 MED ORDER — SODIUM CHLORIDE 0.9 % IV SOLN: INTRAVENOUS | Status: DC

## 2022-08-28 MED ORDER — FENTANYL CITRATE (PF) 100 MCG/2ML IJ SOLN
INTRAMUSCULAR | Status: AC | PRN
  Administered 2022-08-28: 25 ug via INTRAVENOUS

## 2022-08-28 MED ORDER — MIDAZOLAM HCL 2 MG/2ML IJ SOLN
INTRAMUSCULAR | Status: AC
  Filled 2022-08-28: qty 2

## 2022-08-28 MED ORDER — LIDOCAINE VISCOUS HCL 2 % MT SOLN
OROMUCOSAL | Status: AC
  Filled 2022-08-28: qty 15

## 2022-08-28 MED ORDER — FENTANYL CITRATE (PF) 100 MCG/2ML IJ SOLN
INTRAMUSCULAR | Status: AC
  Filled 2022-08-28: qty 2

## 2022-08-28 MED ORDER — LIDOCAINE HCL (PF) 1 % IJ SOLN: 10.0000 mL | Freq: Once | INTRAMUSCULAR | Status: DC

## 2022-08-28 MED ORDER — MIDAZOLAM HCL 2 MG/2ML IJ SOLN
INTRAMUSCULAR | Status: AC | PRN
  Administered 2022-08-28: .5 mg via INTRAVENOUS

## 2022-08-28 NOTE — H&P (Signed)
Chief Complaint: Patient was seen in consultation today for supra pubic catheter exchange/upsize at the request of Gay,Matthew R  Referring Physician(s): Gay,Matthew R  Supervising Physician: Irish Lack  Patient Status: [facility] - Out-pt  History of Present Illness: Gary Sellers is a 88 y.o. male   FULL CODE STATUS per pt and family Parkinson's disease (deep brain stimulator); deconditioning Wheel chair use Urinary retention Suprapubic catheter placed in IR 07/02/22 Has done well  Scheduled today for exchange/upsize supra pubic catheter     Past Medical History:  Diagnosis Date   Dementia [facility])    wife denies history of dementia.   GERD (gastroesophageal reflux disease)    Parkinson disease    Thyroid disease     Past Surgical History:  Procedure Laterality Date   DEEP BRAIN STIMULATOR PLACEMENT      Allergies: Augmentin [amoxicillin-pot clavulanate]  Medications: Prior to Admission medications   Medication Sig Start Date End Date Taking? Authorizing Provider  anastrozole (ARIMIDEX) 1 MG tablet Take by mouth. 01/26/21  Yes [provider]  carbidopa-levodopa (SINEMET IR) 25-100 MG tablet Take 1 tablet by mouth every 3 (three) hours. 12/09/19  Yes [provider]  Coenzyme Q10 10 MG capsule Take by mouth.   Yes [provider]  donepezil (ARICEPT ODT) 5 MG disintegrating tablet Take 5 mg by mouth at bedtime. 08/16/21  Yes [provider]  dorzolamide-timolol (COSOPT) 22.3-6.8 MG/ML ophthalmic solution Place 1 drop into both eyes 2 (two) times daily.  11/25/12  Yes [provider]  enoxaparin (LOVENOX) 100 MG/ML injection Inject 1 mL (100 mg total) into the skin daily. 02/06/20  Yes Doreatha Massed, MD  latanoprost (XALATAN) 0.005 % ophthalmic solution Place 1 drop into both eyes at bedtime. 08/25/21  Yes [provider]  Multiple Vitamins-Minerals (MULTIVITAMIN WITH MINERALS) tablet Take 1 tablet by mouth  daily.   Yes [provider]  nitrofurantoin, macrocrystal-monohydrate, (MACROBID) 100 MG capsule Take by mouth. 02/22/22  Yes [provider]  polyethylene glycol (MIRALAX / GLYCOLAX) packet Take 17 g by mouth daily. 01/07/18  Yes Mesner, Barbara Cower, MD  tamsulosin (FLOMAX) 0.4 MG CAPS capsule Take 0.4 mg by mouth at bedtime.   Yes [provider]  testosterone cypionate (DEPOTESTOSTERONE CYPIONATE) 200 MG/ML injection Inject 100 mg into the muscle every 14 (fourteen) days. 08/14/21  Yes [provider]  apixaban (ELIQUIS) 5 MG TABS tablet Take 1 tablet (5 mg total) by mouth 2 (two) times daily. 12/20/21   Doreatha Massed, MD     History reviewed. No pertinent family history.  Social History   Socioeconomic History   Marital status: Married    Spouse name: Not on file   Number of children: 2   Years of education: Not on file   Highest education level: Not on file  Occupational History   Occupation: Retired Probation officer  Tobacco Use   Smoking status: Never   Smokeless tobacco: Never  Vaping Use   Vaping Use: Never used  Substance and Sexual Activity   Alcohol use: Never   Drug use: Never   Sexual activity: Not Currently  Other Topics Concern   Not on file  Social History Narrative   Not on file   Social Determinants of Health   Financial Resource Strain: Not on file  Food Insecurity: Not on file  Transportation Needs: Not on file  Physical Activity: Not on file  Stress: Not on file  Social Connections: Not on file    Review of Systems:  A 12 point ROS discussed and pertinent positives are indicated in the HPI above.  All other systems are negative.  Vital Signs: BP 136/74   Pulse (!) 55   Temp 98.1 F (36.7 C) (Temporal)   Resp 18   Ht 5\' 10"  (1.778 m)   Wt 150 lb (68 kg)   SpO2 98%   BMI 21.52 kg/m    Physical Exam Vitals reviewed.  Constitutional:      Comments: FRAIL  HENT:     Mouth/Throat:     Mouth: Mucous membranes are  moist.  Cardiovascular:     Rate and Rhythm: Normal rate and regular rhythm.  Pulmonary:     Effort: Pulmonary effort is normal.     Breath sounds: Normal breath sounds.  Abdominal:     Palpations: Abdomen is soft.  Skin:    General: Skin is warm.  Neurological:     Mental Status: He is alert and oriented to person, place, and time.  Psychiatric:        Behavior: Behavior normal.     Imaging: No results found.  Labs:  CBC: Recent Labs    10/11/21 1251 03/06/22 1000  WBC 7.3 6.8  HGB 14.5 14.0  HCT 44.3 42.8  PLT 215 173    COAGS: No results for input(s): "INR", "APTT" in the last 8760 hours.  BMP: Recent Labs    10/11/21 1251 03/06/22 1000  NA 135 137  K 4.5 4.4  CL 102 103  CO2 26 29  GLUCOSE 103* 117*  BUN 28* 26*  CALCIUM 9.4 8.9  CREATININE 1.50* 1.06  GFRNONAA 45* >60    LIVER FUNCTION TESTS: Recent Labs    03/06/22 1000  BILITOT 0.6  AST 19  ALT 11  ALKPHOS 80  PROT 5.7*  ALBUMIN 3.3*    TUMOR MARKERS: No results for input(s): "AFPTM", "CEA", "CA199", "CHROMGRNA" in the last 8760 hours.  Assessment and Plan:  Urinary retention SP catheter placed in IR 07/02/22 For exchange/upsize today Pt and family are aware of procedure benefits and risks - including but not limited to' infection; bleeding; damage to bladder or surrounding structures Agreeable to proceed Consent signed and in chart  Thank you for this interesting consult.  I greatly enjoyed meeting Gary Sellers and look forward to participating in their care.  A copy of this report was sent to the requesting provider on this date.  Electronically Signed: Robet Leu, PA-C 08/28/2022, 10:01 AM   I spent a total of    25 Minutes in face to face in clinical consultation, greater than 50% of which was counseling/coordinating care for SP catheter exchange

## 2022-08-28 NOTE — Progress Notes (Addendum)
Beckey Downing PA with radiology stated patient could resume eliquis tomorrow. Instructed patient and his son/ wife.

## 2022-08-28 NOTE — Procedures (Signed)
Interventional Radiology Procedure Note  Procedure: FLUORO SP TUBE UPSIZE/EXCHG    Complications: None  Estimated Blood Loss:  MIN  Findings: 16 FR COUNCIL TIP CATH PLACED     M. Ruel Favors, MD

## 2022-09-01 ENCOUNTER — Other Ambulatory Visit: Payer: Self-pay | Admitting: Hematology

## 2022-09-06 NOTE — Progress Notes (Unsigned)
[facility] [address], [place] [address]   CLINIC:  Medical Oncology/Hematology  PCP:  System, Provider Not In No address on file None   REASON FOR VISIT:  Follow-up for PE x2 (2014 and 2021)   PRIOR THERAPY: Eliquis for 6 months in 01/2013   CURRENT THERAPY: Eliquis 5 mg BID  INTERVAL HISTORY:   [name] 88 y.o. male returns for routine follow-up for his history of recurrent unprovoked pulmonary embolisms.  He was last seen by Rojelio Brenner PA-C on 03/06/2022.  He is accompanied today by his wife and caregiver [facility]), who assists with history.   At today's visit, he reports feeling fair. *** Since his last visit, he has now had chronic indwelling Foley catheter. No recent hospitalizations, surgeries, or changes in baseline health status.   He continues to take Eliquis as prescribed. *** He denies any bright red blood per rectum or melena.*** *** Recent falls *** *** His wife and caregiver are very careful to decrease his risk of falling at home, in light of his Parkinson's disease. *** No current symptoms concerning for recurrent DVT or PE.  He has ***% energy and ***% appetite. He endorses that he is maintaining a stable weight.   ASSESSMENT & PLAN:  1.  Recurrent unprovoked pulmonary embolism: - He had DVT/PE in the leg few years ago (October 2014) and was treated with 6 months of anticoagulation.  (Precipitating circumstances unknown.  Patient is poor historian.) - CT angio on 12/28/2019 shows acute bilateral pulmonary emboli with CT evidence of right heart strain. - Patient mobility is slightly limited by Parkinson's disease.*** - Reviewed labs from recent hospitalization from 01/01/2020.  Factor V Leiden, prothrombin gene mutation, lupus anticoagulant, beta-2 glycoprotein 1 antibody, anticardiolipin antibody were negative.  Protein C&S levels were normal.  Antithrombin III was normal. - He is continuing Eliquis twice daily.  No bright red  blood per rectum or melena.*** - ED visit on 09/02/2021 for fall.  Wife denies any falls at home since that time.*** - Labs today (***): D-dimer <***.  Normal CBC with Hgb ***.  Creatinine ***. - As he had unprovoked to weakly provoked pulmonary embolism with right heart strain in 2021, we have recommended indefinite anticoagulation at this time, especially in light of his prior DVT/PE in 2014. - Most recently recorded weight is approximately 68 kg (10/11/2021) - he was unable to stand to check weight at today's visit*** - Discussed extensively with patient and his wife that risks of continuing anticoagulation include possibility of severe hemorrhage causing death by exsanguination, as well as the risk of cerebral hemorrhage or subdural hematoma given his high risk of falls related to his Parkinson's disease.  Patient and his wife expressed a strong preference to remain on Eliquis at this time, as they are very concerned about any recurrent blood clots in the future.  They verbalized understanding and acceptance of the risks of continued anticoagulation at this time*** - PLAN: Continue Eliquis 5 mg twice daily (since patient is age >80 years, he would require dose adjustment if weight <60 kg OR creatinine >1.5)). -  Repeat CMP, D-dimer and CBC with office visit in 6 months.***   2.  Parkinson's disease: - He has deep brain stimulator (DBS) placed at [facility] in 2009.  He has dysarthria and hypophonia. - Battery was changed on 02/14/2020.    PLAN SUMMARY: >> *** >> *** >> ***    [facility] at [facility] **VISIT  SUMMARY & IMPORTANT INSTRUCTIONS **   You were seen today by Rojelio Brenner PA-C for your ***.    *** ***  *** ***  LABS: Return in ***   OTHER TESTS: ***  MEDICATIONS: ***  FOLLOW-UP APPOINTMENT: ***     REVIEW OF SYSTEMS: ***  Review of Systems - Oncology   PHYSICAL EXAM:  ECOG PERFORMANCE STATUS: {CHL ONC ECOG WJ:[id number]} *** There were no  vitals filed for this visit. There were no vitals filed for this visit. Physical Exam  PAST MEDICAL/SURGICAL HISTORY:  Past Medical History:  Diagnosis Date   Dementia (HCC)    wife denies history of dementia.   GERD (gastroesophageal reflux disease)    Parkinson disease    Thyroid disease    Past Surgical History:  Procedure Laterality Date   DEEP BRAIN STIMULATOR PLACEMENT     IR CATHETER TUBE CHANGE  08/28/2022    SOCIAL HISTORY:  Social History   Socioeconomic History   Marital status: Married    Spouse name: Not on file   Number of children: 2   Years of education: Not on file   Highest education level: Not on file  Occupational History   Occupation: Retired Probation officer  Tobacco Use   Smoking status: Never   Smokeless tobacco: Never  Vaping Use   Vaping Use: Never used  Substance and Sexual Activity   Alcohol use: Never   Drug use: Never   Sexual activity: Not Currently  Other Topics Concern   Not on file  Social History Narrative   Not on file   Social Determinants of Health   Financial Resource Strain: Not on file  Food Insecurity: Not on file  Transportation Needs: Not on file  Physical Activity: Not on file  Stress: Not on file  Social Connections: Not on file  Intimate Partner Violence: Not on file    FAMILY HISTORY:  No family history on file.  CURRENT MEDICATIONS:  Outpatient Encounter Medications as of 09/08/2022  Medication Sig   anastrozole (ARIMIDEX) 1 MG tablet Take by mouth.   carbidopa-levodopa (SINEMET IR) 25-100 MG tablet Take 1 tablet by mouth every 3 (three) hours.   Coenzyme Q10 10 MG capsule Take by mouth.   donepezil (ARICEPT ODT) 5 MG disintegrating tablet Take 5 mg by mouth at bedtime.   dorzolamide-timolol (COSOPT) 22.3-6.8 MG/ML ophthalmic solution Place 1 drop into both eyes 2 (two) times daily.    ELIQUIS 5 MG TABS tablet Take 1 tablet by mouth twice daily   enoxaparin (LOVENOX) 100 MG/ML injection Inject 1 mL (100 mg total)  into the skin daily.   latanoprost (XALATAN) 0.005 % ophthalmic solution Place 1 drop into both eyes at bedtime.   Multiple Vitamins-Minerals (MULTIVITAMIN WITH MINERALS) tablet Take 1 tablet by mouth daily.   nitrofurantoin, macrocrystal-monohydrate, (MACROBID) 100 MG capsule Take by mouth.   polyethylene glycol (MIRALAX / GLYCOLAX) packet Take 17 g by mouth daily.   tamsulosin (FLOMAX) 0.4 MG CAPS capsule Take 0.4 mg by mouth at bedtime.   testosterone cypionate (DEPOTESTOSTERONE CYPIONATE) 200 MG/ML injection Inject 100 mg into the muscle every 14 (fourteen) days.   No facility-administered encounter medications on file as of 09/08/2022.    ALLERGIES:  Allergies  Allergen Reactions   Augmentin [Amoxicillin-Pot Clavulanate] Other (See Comments)    Felt like he had tightness in his chest    LABORATORY DATA:  I have reviewed the labs as listed.  CBC    Component Value Date/Time   WBC  6.8 03/06/2022 1000   RBC 4.45 03/06/2022 1000   HGB 14.0 03/06/2022 1000   HCT 42.8 03/06/2022 1000   PLT 173 03/06/2022 1000   MCV 96.2 03/06/2022 1000   MCH 31.5 03/06/2022 1000   MCHC 32.7 03/06/2022 1000   RDW 13.4 03/06/2022 1000   LYMPHSABS 1.1 07/02/2021 1311   MONOABS 0.6 07/02/2021 1311   EOSABS 0.5 07/02/2021 1311   BASOSABS 0.1 07/02/2021 1311      Latest Ref Rng & Units 03/06/2022   10:00 AM 10/11/2021   12:51 PM 07/02/2021    1:11 PM  CMP  Glucose 70 - 99 mg/dL 161  096  94   BUN 8 - 23 mg/dL 26  28  31    Creatinine 0.61 - 1.24 mg/dL 0.45  4.09  8.11   Sodium 135 - 145 mmol/L 137  135  135   Potassium 3.5 - 5.1 mmol/L 4.4  4.5  4.2   Chloride 98 - 111 mmol/L 103  102  101   CO2 22 - 32 mmol/L 29  26  25    Calcium 8.9 - 10.3 mg/dL 8.9  9.4  8.9   Total Protein 6.5 - 8.1 g/dL 5.7   5.9   Total Bilirubin 0.3 - 1.2 mg/dL 0.6   0.6   Alkaline Phos 38 - 126 U/L 80   85   AST 15 - 41 U/L 19   18   ALT 0 - 44 U/L 11   11     DIAGNOSTIC IMAGING:  I have independently reviewed  the relevant imaging and discussed with the patient.   WRAP UP:  All questions were answered. The patient knows to call the clinic with any problems, questions or concerns.  Medical decision making: ***  Time spent on visit: I spent *** minutes counseling the patient face to face. The total time spent in the appointment was *** minutes and more than 50% was on counseling.  Carnella Guadalajara, PA-C  ***

## 2022-09-08 ENCOUNTER — Inpatient Hospital Stay: Payer: Medicare Other | Attending: Physician Assistant | Admitting: Physician Assistant

## 2022-09-08 ENCOUNTER — Other Ambulatory Visit: Payer: Self-pay

## 2022-09-08 ENCOUNTER — Inpatient Hospital Stay: Payer: Medicare Other

## 2022-09-08 DIAGNOSIS — F0280 Dementia in other diseases classified elsewhere without behavioral disturbance: Secondary | ICD-10-CM | POA: Diagnosis not present

## 2022-09-08 DIAGNOSIS — Z7901 Long term (current) use of anticoagulants: Secondary | ICD-10-CM | POA: Insufficient documentation

## 2022-09-08 DIAGNOSIS — G20A1 Parkinson's disease without dyskinesia, without mention of fluctuations: Secondary | ICD-10-CM | POA: Insufficient documentation

## 2022-09-08 DIAGNOSIS — R471 Dysarthria and anarthria: Secondary | ICD-10-CM | POA: Insufficient documentation

## 2022-09-08 DIAGNOSIS — I2699 Other pulmonary embolism without acute cor pulmonale: Secondary | ICD-10-CM

## 2022-09-08 DIAGNOSIS — Z86718 Personal history of other venous thrombosis and embolism: Secondary | ICD-10-CM | POA: Insufficient documentation

## 2022-09-08 DIAGNOSIS — Z86711 Personal history of pulmonary embolism: Secondary | ICD-10-CM | POA: Insufficient documentation

## 2022-09-08 DIAGNOSIS — Z79899 Other long term (current) drug therapy: Secondary | ICD-10-CM | POA: Insufficient documentation

## 2022-09-08 LAB — CBC WITH DIFFERENTIAL/PLATELET
Abs Immature Granulocytes: 0.03 10*3/uL (ref 0.00–0.07)
Basophils Absolute: 0.1 10*3/uL (ref 0.0–0.1)
Basophils Relative: 1 %
Eosinophils Absolute: 0.3 10*3/uL (ref 0.0–0.5)
Eosinophils Relative: 4 %
HCT: 42.4 % (ref 39.0–52.0)
Hemoglobin: 13.7 g/dL (ref 13.0–17.0)
Immature Granulocytes: 0 %
Lymphocytes Relative: 21 %
Lymphs Abs: 1.5 10*3/uL (ref 0.7–4.0)
MCH: 30.5 pg (ref 26.0–34.0)
MCHC: 32.3 g/dL (ref 30.0–36.0)
MCV: 94.4 fL (ref 80.0–100.0)
Monocytes Absolute: 0.5 10*3/uL (ref 0.1–1.0)
Monocytes Relative: 7 %
Neutro Abs: 4.9 10*3/uL (ref 1.7–7.7)
Neutrophils Relative %: 67 %
Platelets: 296 10*3/uL (ref 150–400)
RBC: 4.49 MIL/uL (ref 4.22–5.81)
RDW: 13.3 % (ref 11.5–15.5)
WBC: 7.3 10*3/uL (ref 4.0–10.5)
nRBC: 0 % (ref 0.0–0.2)

## 2022-09-08 LAB — COMPREHENSIVE METABOLIC PANEL
ALT: 5 U/L (ref 0–44)
AST: 17 U/L (ref 15–41)
Albumin: 3.3 g/dL — ABNORMAL LOW (ref 3.5–5.0)
Alkaline Phosphatase: 80 U/L (ref 38–126)
Anion gap: 7 (ref 5–15)
BUN: 21 mg/dL (ref 8–23)
CO2: 26 mmol/L (ref 22–32)
Calcium: 8.9 mg/dL (ref 8.9–10.3)
Chloride: 100 mmol/L (ref 98–111)
Creatinine, Ser: 0.99 mg/dL (ref 0.61–1.24)
GFR, Estimated: >60 mL/min (ref >60)
Glucose, Bld: 103 mg/dL — ABNORMAL HIGH (ref 70–99)
Potassium: 4.1 mmol/L (ref 3.5–5.1)
Sodium: 133 mmol/L — ABNORMAL LOW (ref 135–145)
Total Bilirubin: 0.6 mg/dL (ref 0.3–1.2)
Total Protein: 6.1 g/dL — ABNORMAL LOW (ref 6.5–8.1)

## 2022-09-08 LAB — D-DIMER, QUANTITATIVE: D-Dimer, Quant: 0.41 ug/mL-FEU (ref 0.00–0.50)

## 2022-09-08 NOTE — Patient Instructions (Signed)
[facility] Discharge Instructions  You were seen today by Surah Pelley PA-C for your history of blood clots.  You are currently on Eliquis, which is a blood thinner that will help prevent any further blood clots.  However, Eliquis also increases your risks of bleeding.  After our in-depth discussion of risks and benefits of blood thinners, you expressed that you would prefer to stay on the blood thinner for now.  This is reasonable, but if you start to have more frequent falls at home, I would recommend that the Eliquis be stopped.  We will see you again in 6 months to reassess ongoing risks and benefits of blood thinners.  Seek immediate medical attention if you have any signs of bleeding.  You should also seek medical attention after any falls at home, especially if you hit your head.  FOLLOW-UP APPOINTMENT: Labs and office visit in 6 months  ** Thank you for trusting me with your healthcare!  I strive to provide all of my patients with quality care at each visit.  If you receive a survey for this visit, I would be so grateful to you for taking the time to provide feedback.  Thank you in advance!  ~ Meldrick Buttery                   Dr. Sreedhar Katragadda   &   Jeorgia Helming, PA-C   - - - - - - - - - - - - - - - - - -     Thank you for choosing [facility] at [facility] to provide your oncology and hematology care.  To afford each patient quality time with our provider, please arrive at least 15 minutes before your scheduled appointment time.   If you have a lab appointment with the Cancer Center please come in thru the Main Entrance and check in at the main information desk.  You need to re-schedule your appointment should you arrive 10 or more minutes late.  We strive to give you quality time with our providers, and arriving late affects you and other patients whose appointments are after yours.  Also, if you no show three or  more times for appointments you may be dismissed from the clinic at the providers discretion.     Again, thank you for choosing [facility].  Our hope is that these requests will decrease the amount of time that you wait before being seen by our physicians.       _____________________________________________________________  Should you have questions after your visit to [facility], please contact our office at [phone] and follow the prompts.  Our office hours are 8:00 a.m. and 4:30 p.m. Monday - Friday.  Please note that voicemails left after 4:00 p.m. may not be returned until the following business day.  We are closed weekends and major holidays.  You do have access to a nurse 24-7, just call the main number to the clinic [phone] and do not press any options, hold on the line and a nurse will answer the phone.    For prescription refill requests, have your pharmacy contact our office and allow 72 hours.    Due to Covid, you will need to wear a mask upon entering the hospital. If you do not have a mask, a mask will be given to you at the Main Entrance upon arrival. For doctor visits, patients may have 1   support person age 18 or older with them. For treatment visits, patients can not have anyone with them due to social distancing guidelines and our immunocompromised population.    

## 2022-09-10 ENCOUNTER — Other Ambulatory Visit: Payer: Self-pay | Admitting: Urology

## 2022-09-10 DIAGNOSIS — N281 Cyst of kidney, acquired: Secondary | ICD-10-CM

## 2022-10-03 ENCOUNTER — Ambulatory Visit
Admission: RE | Admit: 2022-10-03 | Discharge: 2022-10-03 | Disposition: A | Discharge status: Home / Self Care | Payer: Medicare Other | Source: Ambulatory Visit | Attending: Urology | Admitting: Urology

## 2022-10-03 DIAGNOSIS — N281 Cyst of kidney, acquired: Secondary | ICD-10-CM

## 2022-10-03 MED ORDER — GADOPICLENOL 0.5 MMOL/ML IV SOLN
7.5000 mL | Freq: Once | INTRAVENOUS | Status: AC | PRN
  Administered 2022-10-03: 7.5 mL via INTRAVENOUS

## 2022-11-04 ENCOUNTER — Other Ambulatory Visit: Payer: Self-pay | Admitting: Hematology

## 2023-01-04 ENCOUNTER — Other Ambulatory Visit: Payer: Self-pay | Admitting: Physician Assistant

## 2023-03-10 NOTE — Progress Notes (Deleted)
[facility] [address], [place] [address]   CLINIC:  Medical Oncology/Hematology  PCP:  System, Provider Not In No address on file None   REASON FOR VISIT:  Follow-up for PE x2 (2014 and 2021)   PRIOR THERAPY: Eliquis for 6 months in 01/2013   CURRENT THERAPY: Eliquis 5 mg BID  INTERVAL HISTORY:   Gary Sellers 88 y.o. male returns for routine follow-up for his history of recurrent unprovoked pulmonary embolisms.  He was last seen by Rojelio Brenner PA-C on 09/08/2022.  He is accompanied today by his wife Gary Sellers) and caregiver Gary Sellers), who assist with history.***   At today's visit, he is reported to be at about his usual baseline.  ***He is continuing to take Eliquis twice daily as prescribed and has not had any major bleeding episodes such as rectal bleeding, melena, epistaxis, hematuria, or hemoptysis. *** He does remain a fall risk due to his Parkinson's disease, and reportedly fell once in the past 6 months (mechanical fall on 07/26/2022, reportedly hit his head but chose not to pursue ED evaluation).*** ***  No current symptoms concerning for recurrent DVT or PE.  He has 60***% energy and 65***% appetite. He endorses that he is maintaining a stable weight.   ASSESSMENT & PLAN:  1.  Recurrent unprovoked pulmonary embolism: - He had DVT/PE in the leg few years ago (October 2014) and was treated with 6 months of anticoagulation.  (Precipitating circumstances unknown.  Patient is poor historian.) - CT angio on 12/28/2019 shows acute bilateral pulmonary emboli with CT evidence of right heart strain. - Patient mobility is slightly limited by Parkinson's disease.   - Reviewed labs from recent hospitalization from 01/01/2020.  Factor V Leiden, prothrombin gene mutation, lupus anticoagulant, beta-2 glycoprotein 1 antibody, anticardiolipin antibody were negative.  Protein C&S levels were normal.  Antithrombin III was normal. - He is continuing Eliquis twice daily.  No  bright red blood per rectum or melena.*** - He has fallen twice in the past year (ED visit on 09/02/2021 + fall at home on 07/26/2022).*** - Labs today (***): D-dimer ***.  Normal CBC with Hgb ***.  Creatinine ***. - As he had unprovoked to weakly provoked pulmonary embolism with right heart strain in 2021, we have recommended indefinite anticoagulation at this time, especially in light of his prior DVT/PE in 2014. - Most recently recorded weight is approximately 68 kg (150 pounds)*** - Discussed extensively with patient and his wife that risks of continuing anticoagulation include possibility of severe hemorrhage causing death by exsanguination, as well as the risk of cerebral hemorrhage or subdural hematoma given his high risk of falls related to his Parkinson's disease.  Patient and his wife expressed a strong preference to remain on Eliquis at this time, ***as they are very concerned about any recurrent blood clots in the future.  They verbalized understanding and acceptance of the risks of continued anticoagulation at this time - PLAN: Continue Eliquis 5 mg twice daily (since patient is age >80 years, he would require dose adjustment if weight <60 kg OR creatinine >1.5).*** -  Repeat CMP, D-dimer and CBC with office visit in 6 months.***   2.  Parkinson's disease: - He has deep brain stimulator (DBS) placed at [facility] in 2009.  He has dysarthria and hypophonia. - Battery was changed on 02/14/2020.    PLAN SUMMARY:*** >> Same-day labs (CBC/D, CMP, D-dimer) + OFFICE visit in 6 months     REVIEW OF SYSTEMS: ***  Review of Systems  Constitutional:  Positive for fatigue. Negative for appetite change, chills, diaphoresis, fever and unexpected weight change.  HENT:   Negative for lump/mass and nosebleeds.   Eyes:  Negative for eye problems.  Respiratory:  Negative for cough, hemoptysis and shortness of breath.   Cardiovascular:  Negative for chest pain, leg swelling and palpitations.   Gastrointestinal:  Positive for constipation. Negative for abdominal pain, blood in stool, diarrhea, nausea and vomiting.  Genitourinary:  Positive for difficulty urinating (Chronic indwelling catheter). Negative for hematuria.   Skin: Negative.   Neurological:  Negative for dizziness, headaches and light-headedness.  Hematological:  Does not bruise/bleed easily.     PHYSICAL EXAM:  ECOG PERFORMANCE STATUS: 3 - Symptomatic, >50% confined to bed *** There were no vitals filed for this visit. There were no vitals filed for this visit. Physical Exam Vitals reviewed.  Constitutional:      Appearance: Normal appearance.     Comments: Ambulates with walker  Cardiovascular:     Rate and Rhythm: Normal rate and regular rhythm.     Pulses: Normal pulses.     Heart sounds: Normal heart sounds.  Pulmonary:     Effort: Pulmonary effort is normal.     Breath sounds: Normal breath sounds.  Genitourinary:    Comments: Foley catheter in place. Neurological:     General: No focal deficit present.     Mental Status: He is alert.     Comments: Nonverbal during visit  Psychiatric:        Mood and Affect: Mood normal.        Behavior: Behavior normal.     PAST MEDICAL/SURGICAL HISTORY:  Past Medical History:  Diagnosis Date   Dementia (HCC)    wife denies history of dementia.   GERD (gastroesophageal reflux disease)    Parkinson disease    Thyroid disease    Past Surgical History:  Procedure Laterality Date   DEEP BRAIN STIMULATOR PLACEMENT     IR CATHETER TUBE CHANGE  08/28/2022    SOCIAL HISTORY:  Social History   Socioeconomic History   Marital status: Married    Spouse name: Not on file   Number of children: 2   Years of education: Not on file   Highest education level: Not on file  Occupational History   Occupation: Retired Probation officer  Tobacco Use   Smoking status: Never   Smokeless tobacco: Never  Vaping Use   Vaping status: Never Used  Substance and Sexual Activity    Alcohol use: Never   Drug use: Never   Sexual activity: Not Currently  Other Topics Concern   Not on file  Social History Narrative   Not on file   Social Determinants of Health   Financial Resource Strain: Not on file  Food Insecurity: Not on file  Transportation Needs: Not on file  Physical Activity: Not on file  Stress: Not on file  Social Connections: Not on file  Intimate Partner Violence: Not on file    FAMILY HISTORY:  No family history on file.  CURRENT MEDICATIONS:  Outpatient Encounter Medications as of 03/11/2023  Medication Sig   anastrozole (ARIMIDEX) 1 MG tablet Take by mouth.   carbidopa-levodopa (SINEMET IR) 25-100 MG tablet Take 1 tablet by mouth every 3 (three) hours.   Coenzyme Q10 10 MG capsule Take by mouth.   donepezil (ARICEPT ODT) 5 MG disintegrating tablet Take 5 mg by mouth at bedtime.   dorzolamide-timolol (COSOPT) 22.3-6.8 MG/ML ophthalmic solution Place  1 drop into both eyes 2 (two) times daily.    ELIQUIS 5 MG TABS tablet Take 1 tablet by mouth twice daily   latanoprost (XALATAN) 0.005 % ophthalmic solution Place 1 drop into both eyes at bedtime.   Multiple Vitamins-Minerals (MULTIVITAMIN WITH MINERALS) tablet Take 1 tablet by mouth daily.   nitrofurantoin, macrocrystal-monohydrate, (MACROBID) 100 MG capsule Take by mouth.   polyethylene glycol (MIRALAX / GLYCOLAX) packet Take 17 g by mouth daily.   tamsulosin (FLOMAX) 0.4 MG CAPS capsule Take 0.4 mg by mouth at bedtime.   testosterone cypionate (DEPOTESTOSTERONE CYPIONATE) 200 MG/ML injection Inject 100 mg into the muscle every 14 (fourteen) days.   No facility-administered encounter medications on file as of 03/11/2023.    ALLERGIES:  Allergies  Allergen Reactions   Augmentin [Amoxicillin-Pot Clavulanate] Other (See Comments)    Felt like he had tightness in his chest    LABORATORY DATA:  I have reviewed the labs as listed.  CBC    Component Value Date/Time   WBC 7.3 09/08/2022  0903   RBC 4.49 09/08/2022 0903   HGB 13.7 09/08/2022 0903   HCT 42.4 09/08/2022 0903   PLT 296 09/08/2022 0903   MCV 94.4 09/08/2022 0903   MCH 30.5 09/08/2022 0903   MCHC 32.3 09/08/2022 0903   RDW 13.3 09/08/2022 0903   LYMPHSABS 1.5 09/08/2022 0903   MONOABS 0.5 09/08/2022 0903   EOSABS 0.3 09/08/2022 0903   BASOSABS 0.1 09/08/2022 0903      Latest Ref Rng & Units 09/08/2022    9:03 AM 03/06/2022   10:00 AM 10/11/2021   12:51 PM  CMP  Glucose 70 - 99 mg/dL 409  811  914   BUN 8 - 23 mg/dL 21  26  28    Creatinine 0.61 - 1.24 mg/dL 7.82  9.56  2.13   Sodium 135 - 145 mmol/L 133  137  135   Potassium 3.5 - 5.1 mmol/L 4.1  4.4  4.5   Chloride 98 - 111 mmol/L 100  103  102   CO2 22 - 32 mmol/L 26  29  26    Calcium 8.9 - 10.3 mg/dL 8.9  8.9  9.4   Total Protein 6.5 - 8.1 g/dL 6.1  5.7    Total Bilirubin 0.3 - 1.2 mg/dL 0.6  0.6    Alkaline Phos 38 - 126 U/L 80  80    AST 15 - 41 U/L 17  19    ALT 0 - 44 U/L 5  11      DIAGNOSTIC IMAGING:  I have independently reviewed the relevant imaging and discussed with the patient.   WRAP UP:  All questions were answered. The patient knows to call the clinic with any problems, questions or concerns.  Medical decision making: Low  Time spent on visit: I spent 15 minutes counseling the patient face to face. The total time spent in the appointment was 22 minutes and more than 50% was on counseling.  Carnella Guadalajara, PA-C  ***

## 2023-03-11 ENCOUNTER — Inpatient Hospital Stay: Payer: Medicare Other | Attending: Physician Assistant | Admitting: Physician Assistant

## 2023-03-11 ENCOUNTER — Inpatient Hospital Stay: Payer: Medicare Other

## 2023-03-13 ENCOUNTER — Other Ambulatory Visit: Payer: Self-pay | Admitting: Hematology

## 2023-03-14 ENCOUNTER — Other Ambulatory Visit: Payer: Self-pay | Admitting: Physician Assistant

## 2023-04-28 ENCOUNTER — Inpatient Hospital Stay: Payer: Medicare Other

## 2023-04-28 ENCOUNTER — Inpatient Hospital Stay: Payer: Medicare Other | Admitting: Physician Assistant

## 2023-04-30 ENCOUNTER — Other Ambulatory Visit: Payer: Self-pay | Admitting: *Deleted

## 2023-04-30 MED ORDER — APIXABAN 5 MG PO TABS: 5.0000 mg | ORAL_TABLET | Freq: Two times a day (BID) | ORAL | 3 refills | Status: DC

## 2023-05-21 ENCOUNTER — Other Ambulatory Visit: Payer: Self-pay | Admitting: Otolaryngology

## 2023-05-21 DIAGNOSIS — M25552 Pain in left hip: Secondary | ICD-10-CM

## 2023-05-22 ENCOUNTER — Ambulatory Visit
Admission: RE | Admit: 2023-05-22 | Discharge: 2023-05-22 | Disposition: A | Discharge status: Home / Self Care | Payer: Medicare Other | Source: Ambulatory Visit | Attending: Otolaryngology | Admitting: Otolaryngology

## 2023-05-22 DIAGNOSIS — M25552 Pain in left hip: Secondary | ICD-10-CM

## 2023-05-25 NOTE — Progress Notes (Signed)
 [facility] [address], [place] [address]   CLINIC:  Medical Oncology/Hematology  PCP:  System, Provider Not In No address on file None   REASON FOR VISIT:  Follow-up for PE x2 (2014 and 2021)   PRIOR THERAPY: Eliquis  for 6 months in 01/2013   CURRENT THERAPY: Eliquis  5 mg BID   INTERVAL HISTORY:   Gary Sellers 89 y.o. male returns for routine follow-up for his history of recurrent unprovoked pulmonary embolisms.  He was last seen by Pleasant Barefoot PA-C on 09/08/2022.  He is accompanied today by his wife Milburn) and caregiver [facility]), who assist with history.   At today's visit, he is reported to be at about his usual baseline.  His wife reports that he has fallen 2-3 times in the past 6 months, but has not had any head strikes.  Most recently fell last week in the setting of confusion and weakness related to UTI.  He was seen in urgent care, and x-rays were negative for fracture.  He is continuing to take Eliquis  twice daily as prescribed and has not had any major bleeding episodes such as rectal bleeding, melena, epistaxis, hematuria, or hemoptysis.  No current symptoms concerning for recurrent DVT or PE.   He has 50% energy and 100% appetite. He endorses that he is maintaining a stable weight.   ASSESSMENT & PLAN:  1.  Recurrent unprovoked pulmonary embolism: - He had DVT/PE in the leg few years ago (October 2014) and was treated with 6 months of anticoagulation.  (Precipitating circumstances unknown.  Patient is poor historian.) - CT angio on 12/28/2019 shows acute bilateral pulmonary emboli with CT evidence of right heart strain. - Patient mobility is slightly limited by Parkinson's disease.   - Reviewed labs from recent hospitalization from 01/01/2020.  Factor V Leiden, prothrombin gene mutation, lupus anticoagulant, beta-2 glycoprotein 1 antibody, anticardiolipin antibody were negative.  Protein C&S levels were normal.  Antithrombin III was normal. - He  is continuing Eliquis  twice daily.  No bright red blood per rectum or melena. - He has fallen 2-3 times in the past 6 months - Labs today (05/26/2023): D-dimer normal.  Normal CBC with Hgb 14.2.  Creatinine 1.23/GFR 57. - As he had unprovoked to weakly provoked pulmonary embolism with right heart strain in 2021, we have recommended indefinite anticoagulation at this time, especially in light of his prior DVT/PE in 2014. - Most recently recorded weight from May 2024 is approximately 68 kg (150 pounds), but he is unable to stand for weight at today's visit - Discussed extensively with patient and his wife that risks of continuing anticoagulation include possibility of severe hemorrhage causing death by exsanguination, as well as the risk of cerebral hemorrhage or subdural hematoma given his high risk of falls related to his Parkinson's disease.  Patient and his wife expressed a strong preference to remain on Eliquis  at this time, as they are very concerned about any recurrent blood clots in the future.  They verbalized understanding and acceptance of the risks of continued anticoagulation at this time - PLAN: Continue Eliquis  5 mg twice daily (since patient is age >80 years, he would require dose adjustment if weight <60 kg OR creatinine >1.5). -  Repeat CMP, D-dimer and CBC with office visit in 1 year - Patient/wife/caregiver are aware of indications for him to be seen sooner   2.  Parkinson's disease: - He has deep brain stimulator (DBS) placed at [facility] in 2009.  He has  dysarthria and hypophonia. - Battery was changed on 02/14/2020.     PLAN SUMMARY: >> Same-day labs (CBC/D, CMP, D-dimer) + OFFICE visit in 1 year      REVIEW OF SYSTEMS:   Review of Systems  Constitutional:  Positive for fatigue. Negative for appetite change, chills, diaphoresis, fever and unexpected weight change.  HENT:   Negative for lump/mass and nosebleeds.   Eyes:  Negative for eye problems.  Respiratory:  Negative for  cough, hemoptysis and shortness of breath.   Cardiovascular:  Negative for chest pain, leg swelling and palpitations.  Gastrointestinal:  Positive for constipation. Negative for abdominal pain, blood in stool, diarrhea, nausea and vomiting.  Genitourinary:  Positive for dysuria (recent UTI). Negative for hematuria.   Skin: Negative.   Neurological:  Positive for numbness. Negative for dizziness, headaches and light-headedness.  Hematological:  Does not bruise/bleed easily.     PHYSICAL EXAM:  ECOG PERFORMANCE STATUS: 3 - Symptomatic, >50% confined to bed  Vitals:   05/26/23 1026  BP: (!) 102/54  Pulse: 68  Resp: 18  Temp: (!) 96.6 F (35.9 C)   Filed Weights   Physical Exam Vitals reviewed.  Constitutional:      Appearance: Normal appearance.     Comments: Presents in wheelchair  Cardiovascular:     Rate and Rhythm: Normal rate and regular rhythm.     Pulses: Normal pulses.     Heart sounds: Normal heart sounds.  Pulmonary:     Effort: Pulmonary effort is normal.     Breath sounds: Normal breath sounds.  Neurological:     General: No focal deficit present.     Mental Status: He is alert.     Comments: Nonverbal during visit  Psychiatric:        Mood and Affect: Mood normal.        Behavior: Behavior normal.     PAST MEDICAL/SURGICAL HISTORY:  Past Medical History:  Diagnosis Date   Dementia (HCC)    wife denies history of dementia.   GERD (gastroesophageal reflux disease)    Parkinson disease    Thyroid  disease    Past Surgical History:  Procedure Laterality Date   DEEP BRAIN STIMULATOR PLACEMENT     IR CATHETER TUBE CHANGE  08/28/2022    SOCIAL HISTORY:  Social History   Socioeconomic History   Marital status: Married    Spouse name: Not on file   Number of children: 2   Years of education: Not on file   Highest education level: Not on file  Occupational History   Occupation: Retired Probation Officer  Tobacco Use   Smoking status: Never   Smokeless  tobacco: Never  Vaping Use   Vaping status: Never Used  Substance and Sexual Activity   Alcohol use: Never   Drug use: Never   Sexual activity: Not Currently  Other Topics Concern   Not on file  Social History Narrative   Not on file   Social Drivers of Health   Financial Resource Strain: Not on file  Food Insecurity: Not on file  Transportation Needs: Not on file  Physical Activity: Not on file  Stress: Not on file  Social Connections: Not on file  Intimate Partner Violence: Not on file    FAMILY HISTORY:  No family history on file.  CURRENT MEDICATIONS:  Outpatient Encounter Medications as of 05/26/2023  Medication Sig   anastrozole (ARIMIDEX) 1 MG tablet Take by mouth.   apixaban  (ELIQUIS ) 5 MG TABS tablet Take 1 tablet (  5 mg total) by mouth 2 (two) times daily.   Calcium Gluconate, Antidote, (CALGONATE EX)    carbidopa -levodopa  (SINEMET  IR) 25-100 MG tablet Take 1 tablet by mouth every 3 (three) hours.   Coenzyme Q10 10 MG capsule Take by mouth.   DHEA 25 MG tablet Take by oral route.   donepezil (ARICEPT ODT) 5 MG disintegrating tablet Take 5 mg by mouth at bedtime.   dorzolamide-timolol (COSOPT) 22.3-6.8 MG/ML ophthalmic solution Place 1 drop into both eyes 2 (two) times daily.    latanoprost (XALATAN) 0.005 % ophthalmic solution Place 1 drop into both eyes at bedtime.   Multiple Vitamins-Minerals (MULTIVITAMIN WITH MINERALS) tablet Take 1 tablet by mouth daily.   nitrofurantoin, macrocrystal-monohydrate, (MACROBID) 100 MG capsule Take by mouth.   oxybutynin (DITROPAN-XL) 5 MG 24 hr tablet Take 5 mg by mouth daily.   polyethylene glycol (MIRALAX  / GLYCOLAX ) packet Take 17 g by mouth daily.   sulfamethoxazole-trimethoprim (BACTRIM DS) 800-160 MG tablet Take 1 tablet by mouth 2 (two) times daily.   tamsulosin (FLOMAX) 0.4 MG CAPS capsule Take 0.4 mg by mouth at bedtime.   testosterone cypionate (DEPOTESTOSTERONE CYPIONATE) 200 MG/ML injection Inject 100 mg into the  muscle every 14 (fourteen) days.   thyroid  (ARMOUR THYROID ) 120 MG tablet Take 1 tablet every day by oral route.   No facility-administered encounter medications on file as of 05/26/2023.    ALLERGIES:  Allergies  Allergen Reactions   Augmentin [Amoxicillin-Pot Clavulanate] Other (See Comments)    Felt like he had tightness in his chest    LABORATORY DATA:  I have reviewed the labs as listed.  CBC    Component Value Date/Time   WBC 7.5 05/26/2023 0919   RBC 4.71 05/26/2023 0919   HGB 14.2 05/26/2023 0919   HCT 45.2 05/26/2023 0919   PLT 280 05/26/2023 0919   MCV 96.0 05/26/2023 0919   MCH 30.1 05/26/2023 0919   MCHC 31.4 05/26/2023 0919   RDW 12.6 05/26/2023 0919   LYMPHSABS 1.2 05/26/2023 0919   MONOABS 0.5 05/26/2023 0919   EOSABS 0.6 (H) 05/26/2023 0919   BASOSABS 0.1 05/26/2023 0919      Latest Ref Rng & Units 05/26/2023    9:19 AM 09/08/2022    9:03 AM 03/06/2022   10:00 AM  CMP  Glucose 70 - 99 mg/dL 869  896  882   BUN 8 - 23 mg/dL 19  21  26    Creatinine 0.61 - 1.24 mg/dL 8.76  9.00  8.93   Sodium 135 - 145 mmol/L 133  133  137   Potassium 3.5 - 5.1 mmol/L 4.2  4.1  4.4   Chloride 98 - 111 mmol/L 98  100  103   CO2 22 - 32 mmol/L 26  26  29    Calcium 8.9 - 10.3 mg/dL 9.0  8.9  8.9   Total Protein 6.5 - 8.1 g/dL 5.8  6.1  5.7   Total Bilirubin 0.0 - 1.2 mg/dL 0.4  0.6  0.6   Alkaline Phos 38 - 126 U/L 81  80  80   AST 15 - 41 U/L 38  17  19   ALT 0 - 44 U/L 15  5  11      DIAGNOSTIC IMAGING:  I have independently reviewed the relevant imaging and discussed with the patient.   WRAP UP:  All questions were answered. The patient knows to call the clinic with any problems, questions or concerns.  Medical decision making: Moderate  Time spent on visit: I spent 20 minutes counseling the patient face to face. The total time spent in the appointment was 30 minutes and more than 50% was on counseling.  Pleasant CHRISTELLA Barefoot, PA-C  05/26/23 11:08 AM

## 2023-05-26 ENCOUNTER — Inpatient Hospital Stay (HOSPITAL_BASED_OUTPATIENT_CLINIC_OR_DEPARTMENT_OTHER): Payer: Medicare Other | Admitting: Physician Assistant

## 2023-05-26 ENCOUNTER — Inpatient Hospital Stay: Payer: Medicare Other | Attending: Hematology

## 2023-05-26 VITALS — BP 102/54 | HR 68 | Temp 96.6°F | Resp 18

## 2023-05-26 DIAGNOSIS — Z86718 Personal history of other venous thrombosis and embolism: Secondary | ICD-10-CM | POA: Diagnosis not present

## 2023-05-26 DIAGNOSIS — Z79899 Other long term (current) drug therapy: Secondary | ICD-10-CM | POA: Insufficient documentation

## 2023-05-26 DIAGNOSIS — I2699 Other pulmonary embolism without acute cor pulmonale: Secondary | ICD-10-CM | POA: Diagnosis not present

## 2023-05-26 DIAGNOSIS — Z7901 Long term (current) use of anticoagulants: Secondary | ICD-10-CM

## 2023-05-26 DIAGNOSIS — F0280 Dementia in other diseases classified elsewhere without behavioral disturbance: Secondary | ICD-10-CM | POA: Diagnosis not present

## 2023-05-26 DIAGNOSIS — G20A1 Parkinson's disease without dyskinesia, without mention of fluctuations: Secondary | ICD-10-CM | POA: Diagnosis not present

## 2023-05-26 DIAGNOSIS — Z86711 Personal history of pulmonary embolism: Secondary | ICD-10-CM | POA: Diagnosis present

## 2023-05-26 LAB — CBC WITH DIFFERENTIAL/PLATELET
Abs Immature Granulocytes: 0.05 10*3/uL (ref 0.00–0.07)
Basophils Absolute: 0.1 10*3/uL (ref 0.0–0.1)
Basophils Relative: 1 %
Eosinophils Absolute: 0.6 10*3/uL — ABNORMAL HIGH (ref 0.0–0.5)
Eosinophils Relative: 8 %
HCT: 45.2 % (ref 39.0–52.0)
Hemoglobin: 14.2 g/dL (ref 13.0–17.0)
Immature Granulocytes: 1 %
Lymphocytes Relative: 15 %
Lymphs Abs: 1.2 10*3/uL (ref 0.7–4.0)
MCH: 30.1 pg (ref 26.0–34.0)
MCHC: 31.4 g/dL (ref 30.0–36.0)
MCV: 96 fL (ref 80.0–100.0)
Monocytes Absolute: 0.5 10*3/uL (ref 0.1–1.0)
Monocytes Relative: 6 %
Neutro Abs: 5.1 10*3/uL (ref 1.7–7.7)
Neutrophils Relative %: 69 %
Platelets: 280 10*3/uL (ref 150–400)
RBC: 4.71 MIL/uL (ref 4.22–5.81)
RDW: 12.6 % (ref 11.5–15.5)
WBC: 7.5 10*3/uL (ref 4.0–10.5)
nRBC: 0 % (ref 0.0–0.2)

## 2023-05-26 LAB — COMPREHENSIVE METABOLIC PANEL
ALT: 15 U/L (ref 0–44)
AST: 38 U/L (ref 15–41)
Albumin: 3 g/dL — ABNORMAL LOW (ref 3.5–5.0)
Alkaline Phosphatase: 81 U/L (ref 38–126)
Anion gap: 9 (ref 5–15)
BUN: 19 mg/dL (ref 8–23)
CO2: 26 mmol/L (ref 22–32)
Calcium: 9 mg/dL (ref 8.9–10.3)
Chloride: 98 mmol/L (ref 98–111)
Creatinine, Ser: 1.23 mg/dL (ref 0.61–1.24)
GFR, Estimated: 57 mL/min — ABNORMAL LOW (ref >60)
Glucose, Bld: 130 mg/dL — ABNORMAL HIGH (ref 70–99)
Potassium: 4.2 mmol/L (ref 3.5–5.1)
Sodium: 133 mmol/L — ABNORMAL LOW (ref 135–145)
Total Bilirubin: 0.4 mg/dL (ref 0.0–1.2)
Total Protein: 5.8 g/dL — ABNORMAL LOW (ref 6.5–8.1)

## 2023-05-26 LAB — D-DIMER, QUANTITATIVE: D-Dimer, Quant: 0.45 ug{FEU}/mL (ref 0.00–0.50)

## 2023-05-26 NOTE — Patient Instructions (Signed)
 [facility] at [facility] Discharge Instructions  You were seen today by Pleasant Barefoot PA-C for your history of blood clots.  You are currently on Eliquis , which is a blood thinner that will help prevent any further blood clots.  However, Eliquis  also increases your risks of bleeding.  If you fall and hit your head, this could even cause bleeding on your brain.  After our in-depth discussion of risks and benefits of blood thinners, you expressed that you would prefer to stay on the blood thinner for now.  This is reasonable, but if you start to have more frequent falls at home, I would recommend that the Eliquis  be stopped.  We will see you again in 1 year to reassess ongoing risks and benefits of blood thinners.  Seek immediate medical attention if you have any signs of bleeding.  You should also seek medical attention after any falls at home, especially if you hit your head.  FOLLOW-UP APPOINTMENT: Labs and office visit in 1 year ** If you have any major bleeding episodes, new blood clots, or increased frequency of falls, please contact our office so that we can schedule your appointment sooner.  ** Thank you for trusting me with your healthcare!  I strive to provide all of my patients with quality care at each visit.  If you receive a survey for this visit, I would be so grateful to you for taking the time to provide feedback.  Thank you in advance!  ~ Reiana Poteet                   Dr. Alean Stands   &   Pleasant Barefoot, PA-C   - - - - - - - - - - - - - - - - - -     Thank you for choosing [facility] at [facility] to provide your oncology and hematology care.  To afford each patient quality time with our provider, please arrive at least 15 minutes before your scheduled appointment time.   If you have a lab appointment with the Cancer Center please come in thru the Main Entrance and check in at the main information desk.  You need  to re-schedule your appointment should you arrive 10 or more minutes late.  We strive to give you quality time with our providers, and arriving late affects you and other patients whose appointments are after yours.  Also, if you no show three or more times for appointments you may be dismissed from the clinic at the providers discretion.     Again, thank you for choosing [facility].  Our hope is that these requests will decrease the amount of time that you wait before being seen by our physicians.       _____________________________________________________________  Should you have questions after your visit to [facility], please contact our office at [phone] and follow the prompts.  Our office hours are 8:00 a.m. and 4:30 p.m. Monday - Friday.  Please note that voicemails left after 4:00 p.m. may not be returned until the following business day.  We are closed weekends and major holidays.  You do have access to a nurse 24-7, just call the main number to the clinic [phone] and do not press any options, hold on the line and a nurse will answer the phone.    For prescription refill requests, have your pharmacy contact our office and allow 72 hours.  Due to Covid, you will need to wear a mask upon entering the hospital. If you do not have a mask, a mask will be given to you at the Main Entrance upon arrival. For doctor visits, patients may have 1 support person age 39 or older with them. For treatment visits, patients can not have anyone with them due to social distancing guidelines and our immunocompromised population.

## 2023-09-03 ENCOUNTER — Other Ambulatory Visit: Payer: Self-pay | Admitting: Physician Assistant

## 2024-01-04 ENCOUNTER — Other Ambulatory Visit: Payer: Self-pay | Admitting: Physician Assistant

## 2024-01-06 ENCOUNTER — Other Ambulatory Visit: Payer: Self-pay | Admitting: *Deleted

## 2024-01-06 MED ORDER — APIXABAN 5 MG PO TABS: 5.0000 mg | ORAL_TABLET | Freq: Two times a day (BID) | ORAL | 11 refills | Status: DC

## 2024-03-10 ENCOUNTER — Other Ambulatory Visit: Payer: Self-pay | Admitting: *Deleted

## 2024-03-10 MED ORDER — APIXABAN 5 MG PO TABS: 5.0000 mg | ORAL_TABLET | Freq: Two times a day (BID) | ORAL | 11 refills | Status: AC

## 2024-03-23 ENCOUNTER — Other Ambulatory Visit (HOSPITAL_COMMUNITY): Payer: Self-pay

## 2024-03-23 ENCOUNTER — Telehealth: Payer: Self-pay | Admitting: Pharmacy Technician

## 2024-03-23 NOTE — Telephone Encounter (Signed)
 Oral Oncology Patient Advocate Encounter  After completing a benefits investigation, prior authorization for Eliquis  is not required at this time through [facility].  There is a paid claim from 03/01/2024.  Nathian Stencil (Patty) Chet Burnet, CPhT  [facility], Zelda Salmon, [facility] - Oral Chemotherapy Patient Advocate Specialist III Phone: [phone]  Fax: [phone]

## 2024-04-18 ENCOUNTER — Encounter: Payer: Self-pay | Admitting: *Deleted

## 2024-05-22 ENCOUNTER — Encounter: Payer: Self-pay | Admitting: *Deleted

## 2024-05-23 ENCOUNTER — Inpatient Hospital Stay: Payer: Medicare Other

## 2024-05-23 ENCOUNTER — Inpatient Hospital Stay: Payer: Medicare Other | Admitting: Physician Assistant
